# Patient Record
Sex: Female | Born: 1965 | Race: White | Hispanic: No | Marital: Single | Smoking: Never smoker
Health system: Southern US, Community
[De-identification: ages and names within clinical notes are randomized; demographics above are authoritative.]

## PROBLEM LIST (undated history)

## (undated) DIAGNOSIS — J309 Allergic rhinitis, unspecified: Secondary | ICD-10-CM

## (undated) DIAGNOSIS — M1990 Unspecified osteoarthritis, unspecified site: Secondary | ICD-10-CM

## (undated) DIAGNOSIS — R232 Flushing: Secondary | ICD-10-CM

## (undated) DIAGNOSIS — T7840XA Allergy, unspecified, initial encounter: Secondary | ICD-10-CM

## (undated) DIAGNOSIS — R7303 Prediabetes: Secondary | ICD-10-CM

## (undated) DIAGNOSIS — E0590 Thyrotoxicosis, unspecified without thyrotoxic crisis or storm: Secondary | ICD-10-CM

## (undated) DIAGNOSIS — E890 Postprocedural hypothyroidism: Secondary | ICD-10-CM

## (undated) DIAGNOSIS — I10 Essential (primary) hypertension: Secondary | ICD-10-CM

## (undated) HISTORY — DX: Allergic rhinitis, unspecified: J30.9

## (undated) HISTORY — DX: Postprocedural hypothyroidism: E89.0

## (undated) HISTORY — DX: Allergy, unspecified, initial encounter: T78.40XA

## (undated) HISTORY — DX: Flushing: R23.2

## (undated) HISTORY — DX: Unspecified osteoarthritis, unspecified site: M19.90

## (undated) HISTORY — DX: Prediabetes: R73.03

## (undated) HISTORY — DX: Thyrotoxicosis, unspecified without thyrotoxic crisis or storm: E05.90

## (undated) HISTORY — PX: THYROIDECTOMY, PARTIAL: SHX18

## (undated) HISTORY — PX: REPLACEMENT TOTAL KNEE: SUR1224

---

## 1993-10-30 HISTORY — PX: TUBAL LIGATION: SHX77

## 2012-05-28 ENCOUNTER — Ambulatory Visit: Payer: Self-pay | Admitting: Internal Medicine

## 2016-09-18 ENCOUNTER — Ambulatory Visit
Admission: RE | Admit: 2016-09-18 | Discharge: 2016-09-18 | Disposition: A | Discharge status: Home / Self Care | Payer: BLUE CROSS/BLUE SHIELD | Source: Ambulatory Visit | Attending: Internal Medicine | Admitting: Internal Medicine

## 2016-09-18 ENCOUNTER — Other Ambulatory Visit: Payer: Self-pay | Admitting: Internal Medicine

## 2016-09-18 ENCOUNTER — Other Ambulatory Visit
Admission: RE | Admit: 2016-09-18 | Discharge: 2016-09-18 | Disposition: A | Discharge status: Home / Self Care | Payer: BLUE CROSS/BLUE SHIELD | Source: Ambulatory Visit | Attending: Internal Medicine | Admitting: Internal Medicine

## 2016-09-18 DIAGNOSIS — R079 Chest pain, unspecified: Secondary | ICD-10-CM | POA: Diagnosis not present

## 2016-09-18 DIAGNOSIS — J90 Pleural effusion, not elsewhere classified: Secondary | ICD-10-CM | POA: Insufficient documentation

## 2016-09-18 DIAGNOSIS — O1490 Unspecified pre-eclampsia, unspecified trimester: Secondary | ICD-10-CM

## 2016-09-18 DIAGNOSIS — I2510 Atherosclerotic heart disease of native coronary artery without angina pectoris: Secondary | ICD-10-CM | POA: Diagnosis not present

## 2016-09-18 DIAGNOSIS — E048 Other specified nontoxic goiter: Secondary | ICD-10-CM | POA: Diagnosis not present

## 2016-09-18 DIAGNOSIS — R0602 Shortness of breath: Secondary | ICD-10-CM | POA: Insufficient documentation

## 2016-09-18 DIAGNOSIS — E669 Obesity, unspecified: Secondary | ICD-10-CM | POA: Diagnosis not present

## 2016-09-18 DIAGNOSIS — I517 Cardiomegaly: Secondary | ICD-10-CM | POA: Diagnosis not present

## 2016-09-18 DIAGNOSIS — I2699 Other pulmonary embolism without acute cor pulmonale: Secondary | ICD-10-CM | POA: Diagnosis not present

## 2016-09-18 HISTORY — DX: Essential (primary) hypertension: I10

## 2016-09-18 LAB — COMPREHENSIVE METABOLIC PANEL
ALBUMIN: 3.8 g/dL (ref 3.5–5.0)
ALT: 14 U/L (ref 14–54)
AST: 29 U/L (ref 15–41)
Alkaline Phosphatase: 77 U/L (ref 38–126)
Anion gap: 8 (ref 5–15)
BUN: 9 mg/dL (ref 6–20)
CHLORIDE: 108 mmol/L (ref 101–111)
CO2: 22 mmol/L (ref 22–32)
CREATININE: 0.46 mg/dL (ref 0.44–1.00)
Calcium: 9.4 mg/dL (ref 8.9–10.3)
GFR calc Af Amer: >60 mL/min (ref >60)
GLUCOSE: 107 mg/dL — AB (ref 65–99)
Potassium: 3.9 mmol/L (ref 3.5–5.1)
SODIUM: 138 mmol/L (ref 135–145)
Total Bilirubin: 0.6 mg/dL (ref 0.3–1.2)
Total Protein: 7.2 g/dL (ref 6.5–8.1)

## 2016-09-18 LAB — CBC WITH DIFFERENTIAL/PLATELET
BASOS ABS: 0 10*3/uL (ref 0–0.1)
BASOS PCT: 0 %
EOS PCT: 2 %
Eosinophils Absolute: 0.1 10*3/uL (ref 0–0.7)
HCT: 38.6 % (ref 35.0–47.0)
Hemoglobin: 13.4 g/dL (ref 12.0–16.0)
LYMPHS PCT: 42 %
Lymphs Abs: 3 10*3/uL (ref 1.0–3.6)
MCH: 30 pg (ref 26.0–34.0)
MCHC: 34.7 g/dL (ref 32.0–36.0)
MCV: 86.6 fL (ref 80.0–100.0)
Monocytes Absolute: 0.5 10*3/uL (ref 0.2–0.9)
Monocytes Relative: 7 %
Neutro Abs: 3.5 10*3/uL (ref 1.4–6.5)
Neutrophils Relative %: 49 %
PLATELETS: 235 10*3/uL (ref 150–440)
RBC: 4.46 MIL/uL (ref 3.80–5.20)
RDW: 13.1 % (ref 11.5–14.5)
WBC: 7.1 10*3/uL (ref 3.6–11.0)

## 2016-09-18 LAB — TROPONIN I

## 2016-09-18 LAB — BRAIN NATRIURETIC PEPTIDE: B NATRIURETIC PEPTIDE 5: 169 pg/mL — AB (ref 0.0–100.0)

## 2016-09-18 LAB — FIBRIN DERIVATIVES D-DIMER (ARMC ONLY): Fibrin derivatives D-dimer (ARMC): 833 — ABNORMAL HIGH (ref 0–499)

## 2016-09-18 LAB — CK TOTAL AND CKMB (NOT AT ARMC)
CK, MB: 1.6 ng/mL (ref 0.5–5.0)
RELATIVE INDEX: INVALID (ref 0.0–2.5)
Total CK: 36 U/L — ABNORMAL LOW (ref 38–234)

## 2016-09-18 LAB — TSH

## 2016-09-18 MED ORDER — IOPAMIDOL (ISOVUE-370) INJECTION 76%
75.0000 mL | Freq: Once | INTRAVENOUS | Status: AC | PRN
  Administered 2016-09-18: 75 mL via INTRAVENOUS

## 2016-10-06 DIAGNOSIS — E041 Nontoxic single thyroid nodule: Secondary | ICD-10-CM | POA: Diagnosis not present

## 2016-10-18 ENCOUNTER — Encounter: Payer: Self-pay | Admitting: Primary Care

## 2016-10-18 ENCOUNTER — Ambulatory Visit (INDEPENDENT_AMBULATORY_CARE_PROVIDER_SITE_OTHER): Payer: BLUE CROSS/BLUE SHIELD | Admitting: Primary Care

## 2016-10-18 VITALS — BP 122/68 | HR 78 | Temp 98.0°F | Ht 61.75 in | Wt 226.1 lb

## 2016-10-18 DIAGNOSIS — R7989 Other specified abnormal findings of blood chemistry: Secondary | ICD-10-CM

## 2016-10-18 DIAGNOSIS — Z1231 Encounter for screening mammogram for malignant neoplasm of breast: Secondary | ICD-10-CM

## 2016-10-18 DIAGNOSIS — Z1239 Encounter for other screening for malignant neoplasm of breast: Secondary | ICD-10-CM

## 2016-10-18 DIAGNOSIS — I10 Essential (primary) hypertension: Secondary | ICD-10-CM

## 2016-10-18 DIAGNOSIS — R739 Hyperglycemia, unspecified: Secondary | ICD-10-CM | POA: Diagnosis not present

## 2016-10-18 DIAGNOSIS — E039 Hypothyroidism, unspecified: Secondary | ICD-10-CM | POA: Insufficient documentation

## 2016-10-18 DIAGNOSIS — E0590 Thyrotoxicosis, unspecified without thyrotoxic crisis or storm: Secondary | ICD-10-CM | POA: Diagnosis not present

## 2016-10-18 LAB — HEMOGLOBIN A1C: HEMOGLOBIN A1C: 5.4 % (ref 4.6–6.5)

## 2016-10-18 LAB — BRAIN NATRIURETIC PEPTIDE: PRO B NATRI PEPTIDE: 41 pg/mL (ref 0.0–100.0)

## 2016-10-18 NOTE — Progress Notes (Signed)
Subjective:    Patient ID: Hannah Fritz, female    DOB: May 24, 1966, 50 y.o.   MRN: [id number]  HPI  [name] is a 50 year old female who presents today to establish care and discuss the problems mentioned below. Will obtain old records. Her last physical was years ago.   1) Essential Hypertension: Currently managed on lisinopril 5 mg and metoprolol tartrate 25 mg BID. She was also placed on furosemide 20 mg per cardiology in November due to complaints of shortness of breath and a BNP of 169. She was origionally thought to have a PE, but this was ruled out by CT. She was then found to have thyroid mass which was later diagnosed as hypothyroidism. She denies chest pain, shortness of breath, lower extremity edema. She's unsure why she's taking lasix.  2) Hyperthyroidism: Currently managed on methimazole 10 mg. She is currently following with Dr. Gabriel Carina through [facility]. She is due for re-evaluation Friday this week. She will be going for a thyroid ultrasound and biopsy.  3) Knee Pain: Located to the left knee that has been present for the past 1 week. She's also noticed swelling. She has a family history of osteoarthritis to the knees. Over the past several days her pain has improvement. She denies recent injury/trauma, weakness. She is feeling much better.  Review of Systems  Constitutional: Negative for fatigue.  Eyes: Negative for visual disturbance.  Respiratory: Negative for shortness of breath.   Cardiovascular: Negative for chest pain and palpitations.  Endocrine: Negative for heat intolerance.  Musculoskeletal: Positive for arthralgias.  Neurological: Negative for dizziness.       Past Medical History:  Diagnosis Date  . Hypertension   . Hyperthyroidism      Social History   Social History  . Marital status: Single    Spouse name: N/A  . Number of children: N/A  . Years of education: N/A   Occupational History  . Not on file.   Social History Main Topics  .  Smoking status: Never Smoker  . Smokeless tobacco: Not on file  . Alcohol use Yes  . Drug use: Unknown  . Sexual activity: Not on file   Other Topics Concern  . Not on file   Social History Narrative   Married.   3 children. 5 grandchildren.   Works as a Agricultural engineer.   Enjoys shopping, reading, spending time with family.    Past Surgical History:  Procedure Laterality Date  . TUBAL LIGATION  1995    Family History  Problem Relation Age of Onset  . Arthritis Father   . Hypertension Paternal Grandmother   . Hypertension Paternal Grandfather     Allergies  Allergen Reactions  . Sulfa Antibiotics Hives    No current outpatient prescriptions on file prior to visit.   No current facility-administered medications on file prior to visit.     BP 122/68   Pulse 78   Temp 98 F (36.7 C) (Oral)   Ht 5' 1.75" (1.568 m)   Wt 226 lb 1.9 oz (102.6 kg)   LMP 09/09/2016   SpO2 97%   BMI 41.69 kg/m    Objective:   Physical Exam  Constitutional: She appears well-nourished.  Neck: Neck supple.  Mass noted to right anterior neck above thyroid. Questionable nodule. Will have biopsy and [place] Friday.  Cardiovascular: Normal rate and regular rhythm.   Pulmonary/Chest: Effort normal and breath sounds normal.  Musculoskeletal: Normal range of motion.  Left knee: She exhibits normal range of motion, no swelling, no deformity and normal alignment. No tenderness found.  Skin: Skin is warm and dry.  Psychiatric: She has a normal mood and affect.          Assessment & Plan:  Hyperglycemia:  Noted on labs from hospital visit in November. Given obesity and HTN will check A1C to ensure no prediabetes. Discussed the importance of a healthy diet and regular exercise in order for weight loss, and to reduce the risk of other medical diseases.  Knee Pain:  Located to left knee x 1 week with swelling. Overall much improved with ibuprofen. Suspect osteoarthritic flare.  Discussed  weight loss, NSAIDS, rest, elevation, ice, knee brace.   Sheral Flow, NP

## 2016-10-18 NOTE — Assessment & Plan Note (Signed)
Following with endocrinology. Due for [place] and biopsy this week. Continue methimazole.

## 2016-10-18 NOTE — Assessment & Plan Note (Signed)
BP stable in the clinic today. Continue lisinopril and metoprolol. Will check BNP today, if normal will consider discontinuing lasix.

## 2016-10-18 NOTE — Patient Instructions (Signed)
Complete lab work prior to leaving today. I will notify you of your results once received.   Call the [facility] and schedule your mammogram.  Please schedule a physical with me within the next 3 months. You may also schedule a lab only appointment 3-4 days prior. We will discuss your lab results in detail during your physical.  Try Tylenol Arthritis or Aleve for knee pain and inflammation if it returns.  It was a pleasure to meet you today! Please don't hesitate to call me with any questions. Welcome to [facility]!

## 2016-10-18 NOTE — Progress Notes (Signed)
Pre visit review using our clinic review tool, if applicable. No additional management support is needed unless otherwise documented below in the visit note. 

## 2016-10-20 DIAGNOSIS — E041 Nontoxic single thyroid nodule: Secondary | ICD-10-CM | POA: Diagnosis not present

## 2016-10-21 DIAGNOSIS — E041 Nontoxic single thyroid nodule: Secondary | ICD-10-CM | POA: Diagnosis not present

## 2016-11-29 DIAGNOSIS — Z79899 Other long term (current) drug therapy: Secondary | ICD-10-CM | POA: Diagnosis not present

## 2016-11-29 DIAGNOSIS — I10 Essential (primary) hypertension: Secondary | ICD-10-CM | POA: Diagnosis not present

## 2016-11-29 DIAGNOSIS — E041 Nontoxic single thyroid nodule: Secondary | ICD-10-CM | POA: Diagnosis not present

## 2016-11-29 DIAGNOSIS — Z791 Long term (current) use of non-steroidal anti-inflammatories (NSAID): Secondary | ICD-10-CM | POA: Diagnosis not present

## 2016-11-29 DIAGNOSIS — E0510 Thyrotoxicosis with toxic single thyroid nodule without thyrotoxic crisis or storm: Secondary | ICD-10-CM | POA: Diagnosis not present

## 2016-11-29 DIAGNOSIS — E0590 Thyrotoxicosis, unspecified without thyrotoxic crisis or storm: Secondary | ICD-10-CM | POA: Diagnosis not present

## 2016-12-14 DIAGNOSIS — E041 Nontoxic single thyroid nodule: Secondary | ICD-10-CM | POA: Diagnosis not present

## 2016-12-15 DIAGNOSIS — E041 Nontoxic single thyroid nodule: Secondary | ICD-10-CM | POA: Diagnosis not present

## 2016-12-15 DIAGNOSIS — E0590 Thyrotoxicosis, unspecified without thyrotoxic crisis or storm: Secondary | ICD-10-CM | POA: Diagnosis not present

## 2017-01-10 ENCOUNTER — Other Ambulatory Visit: Payer: Self-pay | Admitting: Primary Care

## 2017-01-10 DIAGNOSIS — Z0000 Encounter for general adult medical examination without abnormal findings: Secondary | ICD-10-CM

## 2017-01-15 ENCOUNTER — Other Ambulatory Visit (INDEPENDENT_AMBULATORY_CARE_PROVIDER_SITE_OTHER): Payer: BLUE CROSS/BLUE SHIELD

## 2017-01-15 DIAGNOSIS — Z0000 Encounter for general adult medical examination without abnormal findings: Secondary | ICD-10-CM

## 2017-01-15 LAB — LIPID PANEL
CHOLESTEROL: 171 mg/dL (ref 0–200)
HDL: 53.4 mg/dL (ref >39.00)
LDL Cholesterol: 99 mg/dL (ref 0–99)
NonHDL: 117.66
TRIGLYCERIDES: 91 mg/dL (ref 0.0–149.0)
Total CHOL/HDL Ratio: 3
VLDL: 18.2 mg/dL (ref 0.0–40.0)

## 2017-01-15 LAB — COMPREHENSIVE METABOLIC PANEL
ALBUMIN: 3.8 g/dL (ref 3.5–5.2)
ALK PHOS: 108 U/L (ref 39–117)
ALT: 11 U/L (ref 0–35)
AST: 15 U/L (ref 0–37)
BILIRUBIN TOTAL: 0.5 mg/dL (ref 0.2–1.2)
BUN: 12 mg/dL (ref 6–23)
CALCIUM: 9.7 mg/dL (ref 8.4–10.5)
CHLORIDE: 101 meq/L (ref 96–112)
CO2: 29 mEq/L (ref 19–32)
CREATININE: 0.72 mg/dL (ref 0.40–1.20)
GFR: 90.77 mL/min (ref >60.00)
Glucose, Bld: 123 mg/dL — ABNORMAL HIGH (ref 70–99)
Potassium: 4.5 mEq/L (ref 3.5–5.1)
Sodium: 136 mEq/L (ref 135–145)
TOTAL PROTEIN: 7 g/dL (ref 6.0–8.3)

## 2017-01-18 ENCOUNTER — Ambulatory Visit (INDEPENDENT_AMBULATORY_CARE_PROVIDER_SITE_OTHER): Payer: BLUE CROSS/BLUE SHIELD | Admitting: Primary Care

## 2017-01-18 ENCOUNTER — Other Ambulatory Visit (HOSPITAL_COMMUNITY)
Admission: RE | Admit: 2017-01-18 | Discharge: 2017-01-18 | Disposition: A | Discharge status: Home / Self Care | Payer: BLUE CROSS/BLUE SHIELD | Source: Ambulatory Visit | Attending: Primary Care | Admitting: Primary Care

## 2017-01-18 ENCOUNTER — Encounter: Payer: Self-pay | Admitting: Primary Care

## 2017-01-18 VITALS — BP 120/78 | HR 72 | Temp 97.9°F | Ht 61.75 in | Wt 232.5 lb

## 2017-01-18 DIAGNOSIS — I10 Essential (primary) hypertension: Secondary | ICD-10-CM

## 2017-01-18 DIAGNOSIS — Z1211 Encounter for screening for malignant neoplasm of colon: Secondary | ICD-10-CM | POA: Diagnosis not present

## 2017-01-18 DIAGNOSIS — Z0000 Encounter for general adult medical examination without abnormal findings: Secondary | ICD-10-CM

## 2017-01-18 DIAGNOSIS — E0590 Thyrotoxicosis, unspecified without thyrotoxic crisis or storm: Secondary | ICD-10-CM

## 2017-01-18 DIAGNOSIS — R739 Hyperglycemia, unspecified: Secondary | ICD-10-CM

## 2017-01-18 DIAGNOSIS — Z23 Encounter for immunization: Secondary | ICD-10-CM

## 2017-01-18 DIAGNOSIS — R7303 Prediabetes: Secondary | ICD-10-CM | POA: Insufficient documentation

## 2017-01-18 LAB — HEMOGLOBIN A1C: Hgb A1c MFr Bld: 5.7 % (ref 4.6–6.5)

## 2017-01-18 NOTE — Addendum Note (Signed)
Addended by: Modena Nunnery on: 01/18/2017 11:23 AM   Modules accepted: Orders

## 2017-01-18 NOTE — Patient Instructions (Signed)
Complete lab work prior to leaving today. I will notify you of your results once received.   You will be contacted regarding your referral to GI for the colonoscopy.  Please let us know if you have not heard back within one week.   Schedule your mammogram as discussed.  Start exercising. You should be getting 150 minutes of moderate intensity exercise weekly.  It's important to improve your diet by increasing consumption of fresh vegetables and fruits, whole grains, water.  Ensure you are drinking 64 ounces of water daily.  Follow up in 1 year for your annual exam or sooner if needed.  It was a pleasure to see you today!

## 2017-01-18 NOTE — Assessment & Plan Note (Signed)
Td due, administered today. Pap due, completed today. Mammogram due, pending. Colonoscopy due, ordered. Discussed the importance of a healthy diet and regular exercise in order for weight loss, and to reduce the risk of other medical diseases. Exam unremarkable. Labs with hyperglycemia, check A1C. Follow up in 1 year for annual exam.

## 2017-01-18 NOTE — Assessment & Plan Note (Signed)
Due for surgery soon.

## 2017-01-18 NOTE — Assessment & Plan Note (Signed)
Noted on recent labs. A1C in December normal. Recheck A1C today.

## 2017-01-18 NOTE — Assessment & Plan Note (Signed)
Stable today, continue lisinopril and Lopressor. BMP unremarkable.

## 2017-01-18 NOTE — Progress Notes (Signed)
Pre visit review using our clinic review tool, if applicable. No additional management support is needed unless otherwise documented below in the visit note. 

## 2017-01-18 NOTE — Progress Notes (Signed)
Subjective:    Patient ID: Hannah Fritz, female    DOB: [birth year], 51 y.o.   MRN: [id number]  HPI  Hannah Fritz is a 51 year old female who presents today for complete physical.  Immunizations: -Tetanus: Completed over 10 years ago. -Influenza: Did not complete last season.   Diet: She endorses a fair diet. Breakfast: Fast food, crackers, toast Lunch: Left overs Dinner: Restaurants, meat, vegetables, starch, pizza Snacks: Crackers, chips Desserts: Occasionally  Beverages: Water  Exercise: She does not currently exercise. Eye exam: Completed several years ago. Due. Dental exam: Completes annually Colonoscopy: Due. Pap Smear: Due. Pending today. Mammogram: Ordered. Due.   Review of Systems  Constitutional: Negative for unexpected weight change.  HENT: Negative for rhinorrhea.   Respiratory: Negative for cough and shortness of breath.   Cardiovascular: Negative for chest pain.  Gastrointestinal: Negative for constipation and diarrhea.  Genitourinary: Negative for difficulty urinating and menstrual problem.  Musculoskeletal: Negative for arthralgias and myalgias.  Skin: Negative for rash.       Mass to left forearm.  Allergic/Immunologic: Negative for environmental allergies.  Neurological: Negative for dizziness, numbness and headaches.       Past Medical History:  Diagnosis Date  . Hypertension   . Hyperthyroidism      Social History   Social History  . Marital status: Single    Spouse name: N/A  . Number of children: N/A  . Years of education: N/A   Occupational History  . Not on file.   Social History Main Topics  . Smoking status: Never Smoker  . Smokeless tobacco: Never Used  . Alcohol use Yes  . Drug use: Unknown  . Sexual activity: Not on file   Other Topics Concern  . Not on file   Social History Narrative   Married.   3 children. 5 grandchildren.   Works as a Agricultural engineer.   Enjoys shopping, reading, spending time with family.     Past Surgical History:  Procedure Laterality Date  . TUBAL LIGATION  1995    Family History  Problem Relation Age of Onset  . Arthritis Father   . Hypertension Paternal Grandmother   . Hypertension Paternal Grandfather     Allergies  Allergen Reactions  . Sulfa Antibiotics Hives    Current Outpatient Prescriptions on File Prior to Visit  Medication Sig Dispense Refill  . furosemide (LASIX) 20 MG tablet Take 20 mg by mouth daily.     Marland Kitchen lisinopril (PRINIVIL,ZESTRIL) 5 MG tablet Take 5 mg by mouth daily.     . methimazole (TAPAZOLE) 10 MG tablet Take 10 mg by mouth daily.     . metoprolol tartrate (LOPRESSOR) 25 MG tablet Take 25 mg by mouth 2 (two) times daily.      No current facility-administered medications on file prior to visit.     BP 120/78   Pulse 72   Temp 97.9 F (36.6 C) (Oral)   Ht 5' 1.75" (1.568 m)   Wt 232 lb 8 oz (105.5 kg)   SpO2 99%   BMI 42.87 kg/m    Objective:   Physical Exam  Constitutional: She is oriented to person, place, and time. She appears well-nourished.  HENT:  Right Ear: Tympanic membrane and ear canal normal.  Left Ear: Tympanic membrane and ear canal normal.  Nose: Nose normal.  Mouth/Throat: Oropharynx is clear and moist.  Eyes: Conjunctivae and EOM are normal. Pupils are equal, round, and reactive to light.  Neck: Neck supple. No  thyromegaly present.  Cardiovascular: Normal rate and regular rhythm.   No murmur heard. Pulmonary/Chest: Effort normal and breath sounds normal. She has no rales.  Abdominal: Soft. Bowel sounds are normal. There is no tenderness.  Genitourinary: There is no tenderness or lesion on the right labia. There is no tenderness or lesion on the left labia. Cervix exhibits no motion tenderness and no discharge. Right adnexum displays no tenderness. Left adnexum displays no tenderness. No erythema in the vagina. No vaginal discharge found.  Musculoskeletal: Normal range of motion.  Lymphadenopathy:    She  has no cervical adenopathy.  Neurological: She is alert and oriented to person, place, and time. She has normal reflexes. No cranial nerve deficit.  Skin: Skin is warm and dry. No rash noted.  Small 1 cm circular superficial mass to left forearm. Non tender.  Psychiatric: She has a normal mood and affect.          Assessment & Plan:

## 2017-01-19 ENCOUNTER — Encounter: Payer: Self-pay | Admitting: *Deleted

## 2017-01-19 LAB — CYTOLOGY - PAP
Diagnosis: NEGATIVE
HPV: NOT DETECTED

## 2017-01-22 ENCOUNTER — Encounter: Payer: Self-pay | Admitting: *Deleted

## 2017-01-26 DIAGNOSIS — Z882 Allergy status to sulfonamides status: Secondary | ICD-10-CM | POA: Diagnosis not present

## 2017-01-26 DIAGNOSIS — Z6841 Body Mass Index (BMI) 40.0 and over, adult: Secondary | ICD-10-CM | POA: Diagnosis not present

## 2017-01-26 DIAGNOSIS — Z79899 Other long term (current) drug therapy: Secondary | ICD-10-CM | POA: Diagnosis not present

## 2017-01-26 DIAGNOSIS — I471 Supraventricular tachycardia: Secondary | ICD-10-CM | POA: Diagnosis not present

## 2017-01-26 DIAGNOSIS — I10 Essential (primary) hypertension: Secondary | ICD-10-CM | POA: Diagnosis not present

## 2017-01-26 DIAGNOSIS — E0520 Thyrotoxicosis with toxic multinodular goiter without thyrotoxic crisis or storm: Secondary | ICD-10-CM | POA: Diagnosis not present

## 2017-01-26 DIAGNOSIS — E041 Nontoxic single thyroid nodule: Secondary | ICD-10-CM | POA: Diagnosis not present

## 2017-01-26 DIAGNOSIS — J811 Chronic pulmonary edema: Secondary | ICD-10-CM | POA: Diagnosis not present

## 2017-01-26 DIAGNOSIS — E0500 Thyrotoxicosis with diffuse goiter without thyrotoxic crisis or storm: Secondary | ICD-10-CM | POA: Diagnosis not present

## 2017-01-26 DIAGNOSIS — Z888 Allergy status to other drugs, medicaments and biological substances status: Secondary | ICD-10-CM | POA: Diagnosis not present

## 2017-01-26 DIAGNOSIS — E669 Obesity, unspecified: Secondary | ICD-10-CM | POA: Diagnosis not present

## 2017-01-27 DIAGNOSIS — Z79899 Other long term (current) drug therapy: Secondary | ICD-10-CM | POA: Diagnosis not present

## 2017-01-27 DIAGNOSIS — I10 Essential (primary) hypertension: Secondary | ICD-10-CM | POA: Diagnosis not present

## 2017-01-27 DIAGNOSIS — E669 Obesity, unspecified: Secondary | ICD-10-CM | POA: Diagnosis not present

## 2017-01-27 DIAGNOSIS — Z882 Allergy status to sulfonamides status: Secondary | ICD-10-CM | POA: Diagnosis not present

## 2017-01-27 DIAGNOSIS — I471 Supraventricular tachycardia: Secondary | ICD-10-CM | POA: Diagnosis not present

## 2017-01-27 DIAGNOSIS — Z6841 Body Mass Index (BMI) 40.0 and over, adult: Secondary | ICD-10-CM | POA: Diagnosis not present

## 2017-01-27 DIAGNOSIS — E0520 Thyrotoxicosis with toxic multinodular goiter without thyrotoxic crisis or storm: Secondary | ICD-10-CM | POA: Diagnosis not present

## 2017-01-27 DIAGNOSIS — Z888 Allergy status to other drugs, medicaments and biological substances status: Secondary | ICD-10-CM | POA: Diagnosis not present

## 2017-01-31 DIAGNOSIS — E0590 Thyrotoxicosis, unspecified without thyrotoxic crisis or storm: Secondary | ICD-10-CM | POA: Diagnosis not present

## 2017-02-19 DIAGNOSIS — Z9889 Other specified postprocedural states: Secondary | ICD-10-CM | POA: Diagnosis not present

## 2017-02-19 DIAGNOSIS — E0590 Thyrotoxicosis, unspecified without thyrotoxic crisis or storm: Secondary | ICD-10-CM | POA: Diagnosis not present

## 2017-03-20 DIAGNOSIS — E890 Postprocedural hypothyroidism: Secondary | ICD-10-CM | POA: Diagnosis not present

## 2017-03-20 DIAGNOSIS — E042 Nontoxic multinodular goiter: Secondary | ICD-10-CM | POA: Diagnosis not present

## 2017-03-20 DIAGNOSIS — R490 Dysphonia: Secondary | ICD-10-CM | POA: Diagnosis not present

## 2017-03-20 DIAGNOSIS — K5900 Constipation, unspecified: Secondary | ICD-10-CM | POA: Diagnosis not present

## 2017-04-03 DIAGNOSIS — E0590 Thyrotoxicosis, unspecified without thyrotoxic crisis or storm: Secondary | ICD-10-CM | POA: Diagnosis not present

## 2017-04-10 DIAGNOSIS — E890 Postprocedural hypothyroidism: Secondary | ICD-10-CM | POA: Diagnosis not present

## 2017-04-10 DIAGNOSIS — Z9889 Other specified postprocedural states: Secondary | ICD-10-CM | POA: Diagnosis not present

## 2017-04-16 ENCOUNTER — Other Ambulatory Visit: Payer: Self-pay

## 2017-04-16 MED ORDER — LISINOPRIL 5 MG PO TABS: 5.0000 mg | ORAL_TABLET | Freq: Every day | ORAL | 5 refills | Status: DC

## 2017-04-16 MED ORDER — METOPROLOL TARTRATE 25 MG PO TABS: 25.0000 mg | ORAL_TABLET | Freq: Two times a day (BID) | ORAL | 5 refills | Status: DC

## 2017-04-16 NOTE — Telephone Encounter (Signed)
Patient is calling to request refills on her Lisinopril 5mg  and her Metoprolol 25mg .  Last OV: 01/18/17, bp stable and to continue taking lisinopril and metoprolol.  Recheck in 1 year. Last Refill:  Do not see any refills on file for our office.  Lisinopril 5mg , #30, 5 refills sent in today. Metoprolol 25mg  bid #30, 5 refills sent in today.

## 2017-05-18 DIAGNOSIS — M25562 Pain in left knee: Secondary | ICD-10-CM | POA: Diagnosis not present

## 2017-05-18 DIAGNOSIS — M25571 Pain in right ankle and joints of right foot: Secondary | ICD-10-CM | POA: Diagnosis not present

## 2017-09-18 DIAGNOSIS — M25561 Pain in right knee: Secondary | ICD-10-CM | POA: Diagnosis not present

## 2017-09-18 DIAGNOSIS — M25562 Pain in left knee: Secondary | ICD-10-CM | POA: Diagnosis not present

## 2017-10-16 ENCOUNTER — Other Ambulatory Visit: Payer: Self-pay | Admitting: Primary Care

## 2017-10-31 DIAGNOSIS — E890 Postprocedural hypothyroidism: Secondary | ICD-10-CM | POA: Diagnosis not present

## 2017-11-07 DIAGNOSIS — E890 Postprocedural hypothyroidism: Secondary | ICD-10-CM | POA: Diagnosis not present

## 2017-12-19 DIAGNOSIS — E890 Postprocedural hypothyroidism: Secondary | ICD-10-CM | POA: Diagnosis not present

## 2018-01-09 DIAGNOSIS — M25561 Pain in right knee: Secondary | ICD-10-CM | POA: Diagnosis not present

## 2018-01-09 DIAGNOSIS — M25562 Pain in left knee: Secondary | ICD-10-CM | POA: Diagnosis not present

## 2018-01-22 ENCOUNTER — Other Ambulatory Visit: Payer: Self-pay | Admitting: Primary Care

## 2018-01-28 DIAGNOSIS — R948 Abnormal results of function studies of other organs and systems: Secondary | ICD-10-CM | POA: Diagnosis not present

## 2018-01-28 DIAGNOSIS — Z6841 Body Mass Index (BMI) 40.0 and over, adult: Secondary | ICD-10-CM | POA: Diagnosis not present

## 2018-01-29 DIAGNOSIS — R635 Abnormal weight gain: Secondary | ICD-10-CM | POA: Diagnosis not present

## 2018-01-29 DIAGNOSIS — I10 Essential (primary) hypertension: Secondary | ICD-10-CM | POA: Diagnosis not present

## 2018-01-29 DIAGNOSIS — I471 Supraventricular tachycardia: Secondary | ICD-10-CM | POA: Diagnosis not present

## 2018-01-29 DIAGNOSIS — Z6841 Body Mass Index (BMI) 40.0 and over, adult: Secondary | ICD-10-CM | POA: Diagnosis not present

## 2018-02-01 DIAGNOSIS — M545 Low back pain: Secondary | ICD-10-CM | POA: Diagnosis not present

## 2018-02-07 DIAGNOSIS — Z713 Dietary counseling and surveillance: Secondary | ICD-10-CM | POA: Diagnosis not present

## 2018-02-07 DIAGNOSIS — Z6841 Body Mass Index (BMI) 40.0 and over, adult: Secondary | ICD-10-CM | POA: Diagnosis not present

## 2018-02-18 ENCOUNTER — Other Ambulatory Visit: Payer: Self-pay | Admitting: Orthopedic Surgery

## 2018-02-18 DIAGNOSIS — Z713 Dietary counseling and surveillance: Secondary | ICD-10-CM | POA: Diagnosis not present

## 2018-02-18 DIAGNOSIS — Z6841 Body Mass Index (BMI) 40.0 and over, adult: Secondary | ICD-10-CM | POA: Diagnosis not present

## 2018-02-18 DIAGNOSIS — M545 Low back pain: Secondary | ICD-10-CM | POA: Diagnosis not present

## 2018-02-18 DIAGNOSIS — M5416 Radiculopathy, lumbar region: Secondary | ICD-10-CM

## 2018-02-23 ENCOUNTER — Ambulatory Visit
Admission: RE | Admit: 2018-02-23 | Discharge: 2018-02-23 | Disposition: A | Discharge status: Home / Self Care | Payer: BLUE CROSS/BLUE SHIELD | Source: Ambulatory Visit | Attending: Orthopedic Surgery | Admitting: Orthopedic Surgery

## 2018-02-23 DIAGNOSIS — M545 Low back pain: Secondary | ICD-10-CM | POA: Diagnosis not present

## 2018-02-23 DIAGNOSIS — M5416 Radiculopathy, lumbar region: Secondary | ICD-10-CM

## 2018-03-04 DIAGNOSIS — Z713 Dietary counseling and surveillance: Secondary | ICD-10-CM | POA: Diagnosis not present

## 2018-03-04 DIAGNOSIS — Z6841 Body Mass Index (BMI) 40.0 and over, adult: Secondary | ICD-10-CM | POA: Diagnosis not present

## 2018-03-13 DIAGNOSIS — M545 Low back pain: Secondary | ICD-10-CM | POA: Diagnosis not present

## 2018-03-14 DIAGNOSIS — M5416 Radiculopathy, lumbar region: Secondary | ICD-10-CM | POA: Diagnosis not present

## 2018-03-14 DIAGNOSIS — M25561 Pain in right knee: Secondary | ICD-10-CM | POA: Diagnosis not present

## 2018-03-18 ENCOUNTER — Telehealth: Payer: Self-pay | Admitting: Primary Care

## 2018-03-20 ENCOUNTER — Ambulatory Visit: Payer: BLUE CROSS/BLUE SHIELD | Admitting: Primary Care

## 2018-03-20 ENCOUNTER — Encounter: Payer: Self-pay | Admitting: Primary Care

## 2018-03-20 VITALS — BP 140/84 | HR 65 | Temp 97.8°F | Ht 61.75 in | Wt 220.2 lb

## 2018-03-20 DIAGNOSIS — I10 Essential (primary) hypertension: Secondary | ICD-10-CM

## 2018-03-20 DIAGNOSIS — Z01818 Encounter for other preprocedural examination: Secondary | ICD-10-CM

## 2018-03-20 DIAGNOSIS — E0590 Thyrotoxicosis, unspecified without thyrotoxic crisis or storm: Secondary | ICD-10-CM | POA: Diagnosis not present

## 2018-03-20 DIAGNOSIS — R739 Hyperglycemia, unspecified: Secondary | ICD-10-CM

## 2018-03-20 NOTE — Assessment & Plan Note (Signed)
Slightly above goal today, is compliant to her medications. She does endorse a lower reading this morning during her last doctor's appointment.  Will have her monitor and report readings at or above 135/90.

## 2018-03-20 NOTE — Patient Instructions (Signed)
You are cleared for surgery.  Congratulations on your weight loss!  Best of luck during surgery and recovery!

## 2018-03-20 NOTE — Progress Notes (Signed)
Subjective:    Patient ID: Hannah Fritz, female    DOB: April 13, 1966, 52 y.o.   MRN: [id number]  HPI  Hannah Fritz is a 52 year old female who presents today for surgical clearance.  She will be undergoing right total knee replacement within the near future. She underwent lab work this morning through her weight management clinic which included Thyroid labs, CBC, CMP. She's also had lab work completed in April 2019 which included A1C and lipids. A1C of 5.4 and LDL of 122, HDL of 62, TC of 182.  She's compliant to her lisinopril 5 mg daily and metoprolol 25 mg BID. She's not checking her BP at home. Her BP this morning at the weight loss management center was 135/84. She has lost around 20 pounds since her last visit. She denies dizziness, chest pain, shortness of breath.   BP Readings from Last 3 Encounters:  03/20/18 140/84  01/18/17 120/78  10/18/16 122/68     Review of Systems  Constitutional: Negative for unexpected weight change.  HENT: Negative for rhinorrhea.   Respiratory: Negative for cough and shortness of breath.   Cardiovascular: Negative for chest pain.  Gastrointestinal: Negative for constipation and diarrhea.  Genitourinary: Negative for difficulty urinating and menstrual problem.  Musculoskeletal: Negative for arthralgias and myalgias.  Skin: Negative for rash.  Allergic/Immunologic: Negative for environmental allergies.  Neurological: Negative for dizziness, numbness and headaches.       Past Medical History:  Diagnosis Date  . Hypertension   . Hyperthyroidism      Social History   Socioeconomic History  . Marital status: Single    Spouse name: Not on file  . Number of children: Not on file  . Years of education: Not on file  . Highest education level: Not on file  Occupational History  . Not on file  Social Needs  . Financial resource strain: Not on file  . Food insecurity:    Worry: Not on file    Inability: Not on file  . Transportation needs:     Medical: Not on file    Non-medical: Not on file  Tobacco Use  . Smoking status: Never Smoker  . Smokeless tobacco: Never Used  Substance and Sexual Activity  . Alcohol use: Yes  . Drug use: Not on file  . Sexual activity: Not on file  Lifestyle  . Physical activity:    Days per week: Not on file    Minutes per session: Not on file  . Stress: Not on file  Relationships  . Social connections:    Talks on phone: Not on file    Gets together: Not on file    Attends religious service: Not on file    Active member of club or organization: Not on file    Attends meetings of clubs or organizations: Not on file    Relationship status: Not on file  . Intimate partner violence:    Fear of current or ex partner: Not on file    Emotionally abused: Not on file    Physically abused: Not on file    Forced sexual activity: Not on file  Other Topics Concern  . Not on file  Social History Narrative   Married.   3 children. 5 grandchildren.   Works as a Agricultural engineer.   Enjoys shopping, reading, spending time with family.     Family History  Problem Relation Age of Onset  . Arthritis Father   . Hypertension Paternal Grandmother   .  Hypertension Paternal Grandfather     Allergies  Allergen Reactions  . Sulfa Antibiotics Hives    Current Outpatient Medications on File Prior to Visit  Medication Sig Dispense Refill  . lisinopril (PRINIVIL,ZESTRIL) 5 MG tablet Take 1 tablet (5 mg total) by mouth daily. NEED APPOINTMENT FOR ANY MORE REFILLS 30 tablet 0  . metoprolol tartrate (LOPRESSOR) 25 MG tablet Take 1 tablet (25 mg total) by mouth 2 (two) times daily. NEED APPOINTMENT FOR ANY MORE REFILLS 60 tablet 0  . topiramate (TOPAMAX) 50 MG tablet Take 50 mg by mouth.      No current facility-administered medications on file prior to visit.     BP 140/84   Pulse 65   Temp 97.8 F (36.6 C) (Oral)   Ht 5' 1.75" (1.568 m)   Wt 220 lb 4 oz (99.9 kg)   SpO2 99%   BMI 40.61 kg/m     Objective:   Physical Exam  Constitutional: She is oriented to person, place, and time. She appears well-nourished.  HENT:  Right Ear: Tympanic membrane and ear canal normal.  Left Ear: Tympanic membrane and ear canal normal.  Nose: Nose normal.  Mouth/Throat: Oropharynx is clear and moist.  Eyes: Pupils are equal, round, and reactive to light. Conjunctivae and EOM are normal.  Neck: Neck supple. No thyromegaly present.  Cardiovascular: Normal rate and regular rhythm.  No murmur heard. Pulmonary/Chest: Effort normal and breath sounds normal. She has no rales.  Abdominal: Soft. Bowel sounds are normal. There is no tenderness.  Musculoskeletal: Normal range of motion.  Lymphadenopathy:    She has no cervical adenopathy.  Neurological: She is alert and oriented to person, place, and time. She has normal reflexes. No cranial nerve deficit.  Skin: Skin is warm and dry. No rash noted.  Psychiatric: She has a normal mood and affect.          Assessment & Plan:  Surgical Clearance:  Surgery pending for near future, right total knee replacement. Exam today unremarkable. All labs and ECG reviewed from [facility] and are stable. Cleared for surgery. Form signed and faxed.  Pleas Koch, NP

## 2018-03-20 NOTE — Assessment & Plan Note (Signed)
Underwent partial thyroidectomy. TSH from recent labs normal. Following with endocrinology.

## 2018-03-20 NOTE — Assessment & Plan Note (Signed)
Recent A1C of 5.4 which was noted through [place]. Commended her on weight loss. Will continue to monitor.

## 2018-03-26 ENCOUNTER — Other Ambulatory Visit: Payer: Self-pay | Admitting: Primary Care

## 2018-03-27 ENCOUNTER — Other Ambulatory Visit: Payer: Self-pay | Admitting: Primary Care

## 2018-03-27 NOTE — Telephone Encounter (Signed)
Last OV for surgery clearance on 03/20/18 Patient will be due for preventative exam; however, just underwent extensive eval and blood work for surg clearance.  Last Refill # 60 on 01/22/18.  Will refill X 3 months for now and may need preventative exam/follow up in the fall.

## 2018-03-27 NOTE — Telephone Encounter (Signed)
Last office visit for surgery clearance on: 03/20/18  Is past due for preventative health exam but did have extensive exam and blood work in recent months.    Last refill:  #30 on 01/22/18  Will refill X 3 months.

## 2018-04-04 DIAGNOSIS — K5909 Other constipation: Secondary | ICD-10-CM | POA: Diagnosis not present

## 2018-04-04 DIAGNOSIS — Z6841 Body Mass Index (BMI) 40.0 and over, adult: Secondary | ICD-10-CM | POA: Diagnosis not present

## 2018-04-04 DIAGNOSIS — M25569 Pain in unspecified knee: Secondary | ICD-10-CM | POA: Diagnosis not present

## 2018-05-15 DIAGNOSIS — M25569 Pain in unspecified knee: Secondary | ICD-10-CM | POA: Diagnosis not present

## 2018-05-15 DIAGNOSIS — I10 Essential (primary) hypertension: Secondary | ICD-10-CM | POA: Diagnosis not present

## 2018-05-15 DIAGNOSIS — Z6839 Body mass index (BMI) 39.0-39.9, adult: Secondary | ICD-10-CM | POA: Diagnosis not present

## 2018-05-17 DIAGNOSIS — M25561 Pain in right knee: Secondary | ICD-10-CM | POA: Diagnosis not present

## 2018-05-17 DIAGNOSIS — Z01812 Encounter for preprocedural laboratory examination: Secondary | ICD-10-CM | POA: Diagnosis not present

## 2018-05-20 DIAGNOSIS — E6609 Other obesity due to excess calories: Secondary | ICD-10-CM | POA: Diagnosis not present

## 2018-05-20 DIAGNOSIS — I10 Essential (primary) hypertension: Secondary | ICD-10-CM | POA: Diagnosis not present

## 2018-05-20 DIAGNOSIS — Z01818 Encounter for other preprocedural examination: Secondary | ICD-10-CM | POA: Diagnosis not present

## 2018-05-20 DIAGNOSIS — I471 Supraventricular tachycardia: Secondary | ICD-10-CM | POA: Diagnosis not present

## 2018-06-05 DIAGNOSIS — M25561 Pain in right knee: Secondary | ICD-10-CM | POA: Diagnosis not present

## 2018-06-13 DIAGNOSIS — M1711 Unilateral primary osteoarthritis, right knee: Secondary | ICD-10-CM | POA: Diagnosis not present

## 2018-06-18 ENCOUNTER — Other Ambulatory Visit: Payer: Self-pay | Admitting: Primary Care

## 2018-06-20 ENCOUNTER — Other Ambulatory Visit: Payer: Self-pay | Admitting: Primary Care

## 2018-07-22 DIAGNOSIS — Z6839 Body mass index (BMI) 39.0-39.9, adult: Secondary | ICD-10-CM | POA: Diagnosis not present

## 2018-07-22 DIAGNOSIS — Z713 Dietary counseling and surveillance: Secondary | ICD-10-CM | POA: Diagnosis not present

## 2018-07-22 DIAGNOSIS — K5900 Constipation, unspecified: Secondary | ICD-10-CM | POA: Diagnosis not present

## 2018-07-22 DIAGNOSIS — E669 Obesity, unspecified: Secondary | ICD-10-CM | POA: Diagnosis not present

## 2018-09-23 DIAGNOSIS — Z13228 Encounter for screening for other metabolic disorders: Secondary | ICD-10-CM | POA: Diagnosis not present

## 2018-09-23 DIAGNOSIS — I10 Essential (primary) hypertension: Secondary | ICD-10-CM | POA: Diagnosis not present

## 2018-09-23 DIAGNOSIS — Z6841 Body Mass Index (BMI) 40.0 and over, adult: Secondary | ICD-10-CM | POA: Diagnosis not present

## 2018-09-30 ENCOUNTER — Encounter: Payer: Self-pay | Admitting: *Deleted

## 2018-09-30 ENCOUNTER — Other Ambulatory Visit: Payer: Self-pay | Admitting: Primary Care

## 2018-09-30 DIAGNOSIS — I10 Essential (primary) hypertension: Secondary | ICD-10-CM

## 2018-09-30 NOTE — Telephone Encounter (Signed)
BMP and lipids from care everywhere dated 01/29/18 was reviewed during visit and also now. BMP and lipids stable. Refills sent to pharmacy.

## 2018-09-30 NOTE — Telephone Encounter (Signed)
Last office visit 03/20/2018 for Preoperative Clearance.  Last CPE 0322/2018.  No future appointments. Last refilled Lisinopril #30 with 2 refills on 06/18/2018 and Metoprolol 03/27/2018 for #60 with 2 refills.  Last labs done on 01/15/2017.  Refill?

## 2019-03-31 ENCOUNTER — Telehealth: Payer: Self-pay | Admitting: Primary Care

## 2019-03-31 DIAGNOSIS — I10 Essential (primary) hypertension: Secondary | ICD-10-CM

## 2019-04-01 NOTE — Telephone Encounter (Signed)
Please notify patient that she will need a CPE or follow up visit for further refills. We will provide her with a 30 day supply until she's seen. Thanks.

## 2019-04-01 NOTE — Telephone Encounter (Signed)
Please advise last OV 03/20/2018 for preop clearance, last CPE 01/18/2017

## 2019-04-02 ENCOUNTER — Other Ambulatory Visit: Payer: Self-pay | Admitting: Primary Care

## 2019-04-02 DIAGNOSIS — R7303 Prediabetes: Secondary | ICD-10-CM

## 2019-04-02 DIAGNOSIS — E0590 Thyrotoxicosis, unspecified without thyrotoxic crisis or storm: Secondary | ICD-10-CM

## 2019-04-02 DIAGNOSIS — I10 Essential (primary) hypertension: Secondary | ICD-10-CM

## 2019-04-11 ENCOUNTER — Other Ambulatory Visit: Payer: Self-pay

## 2019-04-11 ENCOUNTER — Other Ambulatory Visit (INDEPENDENT_AMBULATORY_CARE_PROVIDER_SITE_OTHER): Payer: BC Managed Care – PPO

## 2019-04-11 ENCOUNTER — Telehealth: Payer: Self-pay

## 2019-04-11 DIAGNOSIS — E0590 Thyrotoxicosis, unspecified without thyrotoxic crisis or storm: Secondary | ICD-10-CM

## 2019-04-11 DIAGNOSIS — I10 Essential (primary) hypertension: Secondary | ICD-10-CM

## 2019-04-11 DIAGNOSIS — R7303 Prediabetes: Secondary | ICD-10-CM | POA: Diagnosis not present

## 2019-04-11 LAB — LIPID PANEL
Cholesterol: 172 mg/dL (ref 0–200)
HDL: 53.4 mg/dL (ref >39.00)
LDL Cholesterol: 103 mg/dL — ABNORMAL HIGH (ref 0–99)
NonHDL: 118.75
Total CHOL/HDL Ratio: 3
Triglycerides: 81 mg/dL (ref 0.0–149.0)
VLDL: 16.2 mg/dL (ref 0.0–40.0)

## 2019-04-11 LAB — COMPREHENSIVE METABOLIC PANEL
ALT: 9 U/L (ref 0–35)
AST: 16 U/L (ref 0–37)
Albumin: 3.9 g/dL (ref 3.5–5.2)
Alkaline Phosphatase: 83 U/L (ref 39–117)
BUN: 15 mg/dL (ref 6–23)
CO2: 24 mEq/L (ref 19–32)
Calcium: 9.2 mg/dL (ref 8.4–10.5)
Chloride: 105 mEq/L (ref 96–112)
Creatinine, Ser: 0.73 mg/dL (ref 0.40–1.20)
GFR: 83.32 mL/min (ref >60.00)
Glucose, Bld: 101 mg/dL — ABNORMAL HIGH (ref 70–99)
Potassium: 4.1 mEq/L (ref 3.5–5.1)
Sodium: 138 mEq/L (ref 135–145)
Total Bilirubin: 0.4 mg/dL (ref 0.2–1.2)
Total Protein: 7 g/dL (ref 6.0–8.3)

## 2019-04-11 LAB — HEMOGLOBIN A1C: Hgb A1c MFr Bld: 5.7 % (ref 4.6–6.5)

## 2019-04-11 LAB — TSH: TSH: 7.12 u[IU]/mL — ABNORMAL HIGH (ref 0.35–4.50)

## 2019-04-11 NOTE — Telephone Encounter (Signed)
Terri in lab said pt has already had labs drawn,nothing further needed.

## 2019-04-11 NOTE — Telephone Encounter (Signed)
[place] [facility] - Client Nonclinical Telephone Record [facility] Client [place] [facility] - Client Client Site [place] Physician Alma Friendly - NP Contact Type Call Who Is Calling Patient / Member / Family / Caregiver Caller Name [place] Phone Number [phone] Patient Name Hannah Fritz Patient DOB November 16, 1965 Call Type Message Only Information Provided Reason for Call Request for General Office Information Initial Comment Caller states she is waiting for her appointment. Additional Comment Call Closed By: Izola Price Transaction Date/Time: 04/11/2019 7:33:50 AM (ET)

## 2019-04-16 ENCOUNTER — Ambulatory Visit (INDEPENDENT_AMBULATORY_CARE_PROVIDER_SITE_OTHER): Payer: BC Managed Care – PPO | Admitting: Primary Care

## 2019-04-16 ENCOUNTER — Encounter: Payer: Self-pay | Admitting: Primary Care

## 2019-04-16 ENCOUNTER — Other Ambulatory Visit: Payer: Self-pay

## 2019-04-16 VITALS — BP 134/80 | HR 69 | Temp 97.9°F | Ht 61.75 in | Wt 249.0 lb

## 2019-04-16 DIAGNOSIS — R232 Flushing: Secondary | ICD-10-CM

## 2019-04-16 DIAGNOSIS — E890 Postprocedural hypothyroidism: Secondary | ICD-10-CM | POA: Diagnosis not present

## 2019-04-16 DIAGNOSIS — M25562 Pain in left knee: Secondary | ICD-10-CM

## 2019-04-16 DIAGNOSIS — I10 Essential (primary) hypertension: Secondary | ICD-10-CM | POA: Diagnosis not present

## 2019-04-16 DIAGNOSIS — G8929 Other chronic pain: Secondary | ICD-10-CM

## 2019-04-16 DIAGNOSIS — R7303 Prediabetes: Secondary | ICD-10-CM

## 2019-04-16 DIAGNOSIS — M25561 Pain in right knee: Secondary | ICD-10-CM

## 2019-04-16 DIAGNOSIS — M25569 Pain in unspecified knee: Secondary | ICD-10-CM | POA: Insufficient documentation

## 2019-04-16 DIAGNOSIS — Z0000 Encounter for general adult medical examination without abnormal findings: Secondary | ICD-10-CM | POA: Diagnosis not present

## 2019-04-16 DIAGNOSIS — J309 Allergic rhinitis, unspecified: Secondary | ICD-10-CM | POA: Diagnosis not present

## 2019-04-16 DIAGNOSIS — Z1211 Encounter for screening for malignant neoplasm of colon: Secondary | ICD-10-CM

## 2019-04-16 DIAGNOSIS — Z1239 Encounter for other screening for malignant neoplasm of breast: Secondary | ICD-10-CM

## 2019-04-16 MED ORDER — MONTELUKAST SODIUM 10 MG PO TABS: 10.0000 mg | ORAL_TABLET | Freq: Every day | ORAL | 0 refills | Status: DC

## 2019-04-16 MED ORDER — VENLAFAXINE HCL ER 37.5 MG PO CP24: 37.5000 mg | ORAL_CAPSULE | Freq: Every day | ORAL | 0 refills | Status: DC

## 2019-04-16 MED ORDER — LEVOTHYROXINE SODIUM 25 MCG PO TABS: ORAL_TABLET | ORAL | 0 refills | Status: DC

## 2019-04-16 MED ORDER — LISINOPRIL 10 MG PO TABS: 10.0000 mg | ORAL_TABLET | Freq: Every day | ORAL | 3 refills | Status: DC

## 2019-04-16 MED ORDER — CELECOXIB 100 MG PO CAPS: 100.0000 mg | ORAL_CAPSULE | Freq: Two times a day (BID) | ORAL | 0 refills | Status: DC

## 2019-04-16 NOTE — Assessment & Plan Note (Signed)
Chronic and improved with OTC treatment but she cannot find. Symptoms could partially be due to untreated hypothyroidism.  Discussed options for treatment and we decided on venlafaxine ER 37.5 mg for mood and hot flashes. We discussed to wait two weeks before starting to see if thyroid treatment improved symptoms.   Follow up in 6 weeks.

## 2019-04-16 NOTE — Assessment & Plan Note (Signed)
Stable when compared to last year. Stressed the importance of diet and exercise. Continue to monitor.

## 2019-04-16 NOTE — Assessment & Plan Note (Signed)
Immunizations UTD. Pap smear UTD. Colonoscopy over due, referral to GI placed. Mammogram overdue, orders placed. Discussed the importance of a healthy diet and regular exercise in order for weight loss, and to reduce the risk of any potential medical problems. Exam stable. Labs reviewed.

## 2019-04-16 NOTE — Assessment & Plan Note (Signed)
TSH of 7 on recent labs, suspect this is secondary to post operative thyroidectomy.   Start with levothyroxine 25 mcg once daily, discussed directions for administration.  Follow up in 6 weeks for repeat TSH.

## 2019-04-16 NOTE — Assessment & Plan Note (Signed)
Borderline high today, also with elevated reading one year ago. Compliant to both lisinopril and metoprolol tartrate. Will increase lisinopril to 10 mg, continue metoprolol tartrate 25 mg BID.  She will start checking BP and send readings in 2 weeks.

## 2019-04-16 NOTE — Assessment & Plan Note (Signed)
Symptoms include scratchy throat, itchy/watery eyes, rhinorrhea. No improvement with all OTC medications, never tried Singulair. Rx for Singulair sent to pharmacy.

## 2019-04-16 NOTE — Assessment & Plan Note (Signed)
Chronic for years, history of right knee replacement in 2019. No improvement with Ibuprofen or Tylenol. She has been taking Celebrex from her husband's Rx with significant improvement.   Will send Rx for her to use PRN. She will update.

## 2019-04-16 NOTE — Patient Instructions (Addendum)
Your thyroid is underactive from the surgery. You need to take a thyroid hormone pill called levothyroxine. Take 1 tablet by mouth every morning on an empty stomach. No food or other medications for 30 minutes.   Use the celebrex pain medication as needed for knee pain. Do not take Ibuprofen or Tylenol with this medication.  We've increased your blood pressure medication, lisinopril, to 10 mg. I sent a new prescription.  You may take the venlafaxine ER 37.5 mg capsules once every morning for mood and hot flashes. Start this two weeks after your thyroid medication.  Start montelukast (Singulair) 10 mg tablets for allergies. Take this at bedtime.  You will be contacted regarding your referral to GI for the colonoscopy.  Please let us know if you have not been contacted within one week.   [facility] your [facility].  Schedule a follow up visit for 6 weeks for thyroid, blood pressure, hot flashes follow up.  It was a pleasure to see you today!   Preventive Care 40-64 Years, Female Preventive care refers to lifestyle choices and visits with your health care provider that can promote health and wellness. What does preventive care include?   A yearly physical exam. This is also called an annual well check.  Dental exams once or twice a year.  Routine eye exams. Ask your health care provider how often you should have your eyes checked.  Personal lifestyle choices, including: ? Daily care of your teeth and gums. ? Regular physical activity. ? Eating a healthy diet. ? Avoiding tobacco and drug use. ? Limiting alcohol use. ? Practicing safe sex. ? Taking low-dose aspirin daily starting at age 60. ? Taking vitamin and mineral supplements as recommended by your health care provider. What happens during an annual well check? The services and screenings done by your health care provider during your annual well check will depend on your age, overall health, lifestyle  risk factors, and family history of disease. Counseling Your health care provider may ask you questions about your:  Alcohol use.  Tobacco use.  Drug use.  Emotional well-being.  Home and relationship well-being.  Sexual activity.  Eating habits.  Work and work Statistician.  Method of birth control.  Menstrual cycle.  Pregnancy history. Screening You may have the following tests or measurements:  Height, weight, and BMI.  Blood pressure.  Lipid and cholesterol levels. These may be checked every 5 years, or more frequently if you are over 84 years old.  Skin check.  Lung cancer screening. You may have this screening every year starting at age 47 if you have a 30-pack-year history of smoking and currently smoke or have quit within the past 15 years.  Colorectal cancer screening. All adults should have this screening starting at age 32 and continuing until age 37. Your health care provider may recommend screening at age 38. You will have tests every 1-10 years, depending on your results and the type of screening test. People at increased risk should start screening at an earlier age. Screening tests may include: ? Guaiac-based fecal occult blood testing. ? Fecal immunochemical test (FIT). ? Stool DNA test. ? Virtual colonoscopy. ? Sigmoidoscopy. During this test, a flexible tube with a tiny camera (sigmoidoscope) is used to examine your rectum and lower colon. The sigmoidoscope is inserted through your anus into your rectum and lower colon. ? Colonoscopy. During this test, a long, thin, flexible tube with a tiny camera (colonoscope) is used to examine  your entire colon and rectum.  Hepatitis C blood test.  Hepatitis B blood test.  Sexually transmitted disease (STD) testing.  Diabetes screening. This is done by checking your blood sugar (glucose) after you have not eaten for a while (fasting). You may have this done every 1-3 years.  Mammogram. This may be done every  1-2 years. Talk to your health care provider about when you should start having regular mammograms. This may depend on whether you have a family history of breast cancer.  BRCA-related cancer screening. This may be done if you have a family history of breast, ovarian, tubal, or peritoneal cancers.  Pelvic exam and Pap test. This may be done every 3 years starting at age 3. Starting at age 52, this may be done every 5 years if you have a Pap test in combination with an HPV test.  Bone density scan. This is done to screen for osteoporosis. You may have this scan if you are at high risk for osteoporosis. Discuss your test results, treatment options, and if necessary, the need for more tests with your health care provider. Vaccines Your health care provider may recommend certain vaccines, such as:  Influenza vaccine. This is recommended every year.  Tetanus, diphtheria, and acellular pertussis (Tdap, Td) vaccine. You may need a Td booster every 10 years.  Varicella vaccine. You may need this if you have not been vaccinated.  Zoster vaccine. You may need this after age 38.  Measles, mumps, and rubella (MMR) vaccine. You may need at least one dose of MMR if you were born in 1957 or later. You may also need a second dose.  Pneumococcal 13-valent conjugate (PCV13) vaccine. You may need this if you have certain conditions and were not previously vaccinated.  Pneumococcal polysaccharide (PPSV23) vaccine. You may need one or two doses if you smoke cigarettes or if you have certain conditions.  Meningococcal vaccine. You may need this if you have certain conditions.  Hepatitis A vaccine. You may need this if you have certain conditions or if you travel or work in places where you may be exposed to hepatitis A.  Hepatitis B vaccine. You may need this if you have certain conditions or if you travel or work in places where you may be exposed to hepatitis B.  Haemophilus influenzae type b (Hib)  vaccine. You may need this if you have certain conditions. Talk to your health care provider about which screenings and vaccines you need and how often you need them. This information is not intended to replace advice given to you by your health care provider. Make sure you discuss any questions you have with your health care provider. Document Released: 11/12/2015 Document Revised: 12/06/2017 Document Reviewed: 08/17/2015 Elsevier Interactive Patient Education  2019 [employer].

## 2019-04-16 NOTE — Progress Notes (Signed)
Subjective:    Patient ID: Hannah Fritz, female    DOB: 06-27-1966, 53 y.o.   MRN: [id number]  HPI  Ms. Hannah Fritz is a 53 year old female who presents today for complete physical. She is also wanting to discuss treatment for hot flashes, chronic knee pain and allergies.   Also labs with hypothyroidism, she is not currently treated. She is post op partial thyroidectomy 1-2 years ago.  See ROS notes.  Immunizations: -Tetanus: Completed in 2018 -Influenza: Due this season   Diet: She endorses a fair diet. Has recently started eating healthier recently this week. Prior to this week she was eating fast food/take out food. She is drinking water and diet soda.  Exercise: She is not exercising  Eye exam: No recent exam Dental exam: Completes annually  Colonoscopy: Never completed, referral placed Pap Smear: Completed in 2018, due in 2021 Mammogram: No recent mammogram  BP Readings from Last 3 Encounters:  04/16/19 134/80  03/20/18 140/84  01/18/17 120/78     Review of Systems  Constitutional: Positive for fatigue. Negative for unexpected weight change.  HENT: Positive for postnasal drip and rhinorrhea.   Eyes: Positive for itching.  Respiratory: Negative for cough and shortness of breath.   Cardiovascular: Negative for chest pain.  Gastrointestinal: Negative for constipation and diarrhea.  Genitourinary: Negative for difficulty urinating.       Daily hot flashes, taking Estroven with improvement but is having trouble obtaining it OTC. She would like to try something prescription strength, has never tried before.  Musculoskeletal: Positive for arthralgias.       Chronic bilateral knee pain for years, history of right knee replacement, is needing left done soon. No improvement with Tylenol or Ibuprofen. She did take some of her husbands Celebrex with improvement. She would like a prescription of her own until she can inquire about her left knee replacement.   Skin: Negative for  rash.  Allergic/Immunologic: Positive for environmental allergies.       Chronic allergies with symptoms of itchy/water eyes, rhinorrhea, etc. No improvement with numerous OTC treatments. Cannot tolerate nasal sprays. Would like Rx strength treatment.  Neurological: Negative for dizziness, numbness and headaches.  Psychiatric/Behavioral: The patient is not nervous/anxious.        Past Medical History:  Diagnosis Date  . Hypertension   . Hyperthyroidism      Social History   Socioeconomic History  . Marital status: Single    Spouse name: Not on file  . Number of children: Not on file  . Years of education: Not on file  . Highest education level: Not on file  Occupational History  . Not on file  Social Needs  . Financial resource strain: Not on file  . Food insecurity    Worry: Not on file    Inability: Not on file  . Transportation needs    Medical: Not on file    Non-medical: Not on file  Tobacco Use  . Smoking status: Never Smoker  . Smokeless tobacco: Never Used  Substance and Sexual Activity  . Alcohol use: Yes  . Drug use: Not on file  . Sexual activity: Not on file  Lifestyle  . Physical activity    Days per week: Not on file    Minutes per session: Not on file  . Stress: Not on file  Relationships  . Social Herbalist on phone: Not on file    Gets together: Not on file  Attends religious service: Not on file    Active member of club or organization: Not on file    Attends meetings of clubs or organizations: Not on file    Relationship status: Not on file  . Intimate partner violence    Fear of current or ex partner: Not on file    Emotionally abused: Not on file    Physically abused: Not on file    Forced sexual activity: Not on file  Other Topics Concern  . Not on file  Social History Narrative   Married.   3 children. 5 grandchildren.   Works as a Agricultural engineer.   Enjoys shopping, reading, spending time with family.      Family  History  Problem Relation Age of Onset  . Arthritis Father   . Hypertension Paternal Grandmother   . Hypertension Paternal Grandfather     Allergies  Allergen Reactions  . Sulfa Antibiotics Hives    Current Outpatient Medications on File Prior to Visit  Medication Sig Dispense Refill  . metoprolol tartrate (LOPRESSOR) 25 MG tablet TAKE 1 TABLET(25 MG TOTAL) BY MOUTH 2(TWO) TIMES DAILY FOR BLOOD PRESSURE. 60 tablet 0   No current facility-administered medications on file prior to visit.     BP 134/80   Pulse 69   Temp 97.9 F (36.6 C) (Tympanic)   Ht 5' 1.75" (1.568 m)   Wt 249 lb (112.9 kg)   SpO2 97%   BMI 45.91 kg/m    Objective:   Physical Exam  Constitutional: She is oriented to person, place, and time. She appears well-nourished.  HENT:  Mouth/Throat: No oropharyngeal exudate.  Eyes: Pupils are equal, round, and reactive to light. EOM are normal.  Neck: Neck supple. No thyromegaly present.  Cardiovascular: Normal rate and regular rhythm.  Respiratory: Effort normal and breath sounds normal.  GI: Soft. Bowel sounds are normal. There is no abdominal tenderness.  Musculoskeletal:     Left knee: She exhibits decreased range of motion. She exhibits no swelling. No tenderness found.     Comments: Chronic bilateral knee pain with decrease in ROM to left.  Neurological: She is alert and oriented to person, place, and time.  Skin: Skin is warm and dry.  Psychiatric: She has a normal mood and affect.           Assessment & Plan:

## 2019-04-21 ENCOUNTER — Encounter: Payer: Self-pay | Admitting: Gastroenterology

## 2019-04-21 NOTE — Telephone Encounter (Signed)
Spoken and notified patient of Kate Clark's comments. Patient verbalized understanding.  

## 2019-04-21 NOTE — Telephone Encounter (Signed)
Called pt about her GI referral.  Pt wanted to let you know she recalled she was taking Celebrex 200mg  vs 100mg .  She was taking (1) one bid and that really worked for her. She has already picked up the 100mg  that were just prescribed.  She wants to know should she take (2) two bid?

## 2019-04-21 NOTE — Telephone Encounter (Signed)
Have her start with 1 capsule BID, then can go up to 2 BID if needed. Have her update me. The goal is to use the least amount that will control her symptoms.

## 2019-04-29 ENCOUNTER — Other Ambulatory Visit: Payer: Self-pay | Admitting: Primary Care

## 2019-04-29 DIAGNOSIS — I10 Essential (primary) hypertension: Secondary | ICD-10-CM

## 2019-05-08 ENCOUNTER — Other Ambulatory Visit: Payer: Self-pay | Admitting: Primary Care

## 2019-05-08 ENCOUNTER — Other Ambulatory Visit: Payer: Self-pay | Admitting: Internal Medicine

## 2019-05-08 DIAGNOSIS — Z1231 Encounter for screening mammogram for malignant neoplasm of breast: Secondary | ICD-10-CM

## 2019-05-13 ENCOUNTER — Other Ambulatory Visit: Payer: Self-pay

## 2019-05-13 ENCOUNTER — Ambulatory Visit: Payer: BC Managed Care – PPO | Admitting: *Deleted

## 2019-05-13 VITALS — Ht 62.0 in | Wt 230.0 lb

## 2019-05-13 DIAGNOSIS — Z1211 Encounter for screening for malignant neoplasm of colon: Secondary | ICD-10-CM

## 2019-05-13 MED ORDER — NA SULFATE-K SULFATE-MG SULF 17.5-3.13-1.6 GM/177ML PO SOLN: 1.0000 | Freq: Once | ORAL | 0 refills | Status: AC

## 2019-05-13 NOTE — Progress Notes (Signed)

## 2019-05-26 ENCOUNTER — Telehealth: Payer: Self-pay | Admitting: Gastroenterology

## 2019-05-26 NOTE — Telephone Encounter (Signed)

## 2019-05-27 ENCOUNTER — Other Ambulatory Visit: Payer: Self-pay

## 2019-05-27 ENCOUNTER — Ambulatory Visit (AMBULATORY_SURGERY_CENTER): Payer: BC Managed Care – PPO | Admitting: Gastroenterology

## 2019-05-27 ENCOUNTER — Other Ambulatory Visit: Payer: Self-pay | Admitting: Primary Care

## 2019-05-27 ENCOUNTER — Encounter: Payer: Self-pay | Admitting: Gastroenterology

## 2019-05-27 VITALS — BP 135/70 | HR 64 | Temp 97.6°F | Resp 14 | Ht 62.0 in | Wt 230.0 lb

## 2019-05-27 DIAGNOSIS — D122 Benign neoplasm of ascending colon: Secondary | ICD-10-CM

## 2019-05-27 DIAGNOSIS — D123 Benign neoplasm of transverse colon: Secondary | ICD-10-CM

## 2019-05-27 DIAGNOSIS — Z1211 Encounter for screening for malignant neoplasm of colon: Secondary | ICD-10-CM

## 2019-05-27 DIAGNOSIS — I10 Essential (primary) hypertension: Secondary | ICD-10-CM

## 2019-05-27 MED ORDER — SODIUM CHLORIDE 0.9 % IV SOLN: 500.0000 mL | Freq: Once | INTRAVENOUS | Status: DC

## 2019-05-27 NOTE — Op Note (Signed)
[facility] Patient Name: Hannah Fritz Procedure Date: 05/27/2019 8:52 AM MRN: [id number] Endoscopist: [place]omp[place]. Loletha Carrow , MD Age: 53 Referring MD:  Date of Birth: 04/18/1966 Gender: Female Account #: [id number] Procedure:                Colonoscopy Indications:              Screening for colorectal malignant neoplasm, This                            is the patient's first colonoscopy Medicines:                Monitored Anesthesia Care Procedure:                Pre-Anesthesia Assessment:                           - Prior to the procedure, a History and Physical                            was performed, and patient medications and                            allergies were reviewed. The patient's tolerance of                            previous anesthesia was also reviewed. The risks                            and benefits of the procedure and the sedation                            options and risks were discussed with the patient.                            All questions were answered, and informed consent                            was obtained. Prior Anticoagulants: The patient has                            taken no previous anticoagulant or antiplatelet                            agents. ASA Grade Assessment: II - A patient with                            mild systemic disease. After reviewing the risks                            and benefits, the patient was deemed in                            satisfactory condition to undergo the procedure.  After obtaining informed consent, the colonoscope                            was passed under direct vision. Throughout the                            procedure, the patient's blood pressure, pulse, and                            oxygen saturations were monitored continuously. The                            Colonoscope was introduced through the anus and                            advanced to the the cecum,  identified by                            appendiceal orifice and ileocecal valve. The                            colonoscopy was performed without difficulty. The                            patient tolerated the procedure well. The quality                            of the bowel preparation was good. The ileocecal                            valve, appendiceal orifice, and rectum were                            photographed. Scope In: 8:56:34 AM Scope Out: 9:10:10 AM Scope Withdrawal Time: 0 hours 9 minutes 52 seconds  Total Procedure Duration: 0 hours 13 minutes 36 seconds  Findings:                 The perianal and digital rectal examinations were                            normal.                           Two sessile polyps were found in the transverse                            colon and ascending colon. The polyps were 4 mm in                            size. These polyps were removed with a cold snare.                            Resection and retrieval were complete.  The exam was otherwise without abnormality on                            direct and retroflexion views. Complications:            No immediate complications. Estimated Blood Loss:     Estimated blood loss was minimal. Impression:               - Two 4 mm polyps in the transverse colon and in                            the ascending colon, removed with a cold snare.                            Resected and retrieved.                           - The examination was otherwise normal on direct                            and retroflexion views. Recommendation:           - Patient has a contact number available for                            emergencies. The signs and symptoms of potential                            delayed complications were discussed with the                            patient. Return to normal activities tomorrow.                            Written discharge instructions were  provided to the                            patient.                           - Resume previous diet.                           - Continue present medications.                           - Await pathology results.                           - Repeat colonoscopy is recommended for                            surveillance. The colonoscopy date will be                            determined after pathology results from today's  exam become available for review. Henry L. Loletha Carrow, MD 05/27/2019 9:16:07 AM This report has been signed electronically.

## 2019-05-27 NOTE — Progress Notes (Signed)
Pt's states no medical or surgical changes since previsit or office visit. 

## 2019-05-27 NOTE — Patient Instructions (Signed)
Handout given : Polyps   YOU HAD AN ENDOSCOPIC PROCEDURE TODAY AT [facility]:   Refer to the procedure report that was given to you for any specific questions about what was found during the examination.  If the procedure report does not answer your questions, please call your gastroenterologist to clarify.  If you requested that your care partner not be given the details of your procedure findings, then the procedure report has been included in a sealed envelope for you to review at your convenience later.  YOU SHOULD EXPECT: Some feelings of bloating in the abdomen. Passage of more gas than usual.  Walking can help get rid of the air that was put into your GI tract during the procedure and reduce the bloating. If you had a lower endoscopy (such as a colonoscopy or flexible sigmoidoscopy) you may notice spotting of blood in your stool or on the toilet paper. If you underwent a bowel prep for your procedure, you may not have a normal bowel movement for a few days.  Please Note:  You might notice some irritation and congestion in your nose or some drainage.  This is from the oxygen used during your procedure.  There is no need for concern and it should clear up in a day or so.  SYMPTOMS TO REPORT IMMEDIATELY:   Following lower endoscopy (colonoscopy or flexible sigmoidoscopy):  Excessive amounts of blood in the stool  Significant tenderness or worsening of abdominal pains  Swelling of the abdomen that is new, acute  Fever of 100F or higher   For urgent or emergent issues, a gastroenterologist can be reached at any hour by calling [phone].   DIET:  We do recommend a small meal at first, but then you may proceed to your regular diet.  Drink plenty of fluids but you should avoid alcoholic beverages for 24 hours.  ACTIVITY:  You should plan to take it easy for the rest of today and you should NOT DRIVE or use heavy machinery until tomorrow (because of the sedation  medicines used during the test).    FOLLOW UP: Our staff will call the number listed on your records 48-72 hours following your procedure to check on you and address any questions or concerns that you may have regarding the information given to you following your procedure. If we do not reach you, we will leave a message.  We will attempt to reach you two times.  During this call, we will ask if you have developed any symptoms of COVID 19. If you develop any symptoms (ie: fever, flu-like symptoms, shortness of breath, cough etc.) before then, please call [phone].  If you test positive for Covid 19 in the 2 weeks post procedure, please call and report this information to [place].    If any biopsies were taken you will be contacted by phone or by letter within the next 1-3 weeks.  Please call us at [phone] if you have not heard about the biopsies in 3 weeks.    SIGNATURES/CONFIDENTIALITY: You and/or your care partner have signed paperwork which will be entered into your electronic medical record.  These signatures attest to the fact that that the information above on your After Visit Summary has been reviewed and is understood.  Full responsibility of the confidentiality of this discharge information lies with you and/or your care-partner.

## 2019-05-27 NOTE — Progress Notes (Signed)
Pt Drowsy. VSS. To PACU, report to RN. No anesthetic complications noted.  

## 2019-05-28 ENCOUNTER — Encounter: Payer: Self-pay | Admitting: Primary Care

## 2019-05-28 ENCOUNTER — Ambulatory Visit (INDEPENDENT_AMBULATORY_CARE_PROVIDER_SITE_OTHER): Payer: BC Managed Care – PPO | Admitting: Primary Care

## 2019-05-28 ENCOUNTER — Other Ambulatory Visit: Payer: Self-pay | Admitting: Primary Care

## 2019-05-28 VITALS — BP 126/82 | HR 72 | Temp 98.0°F | Ht 62.0 in | Wt 251.2 lb

## 2019-05-28 DIAGNOSIS — M25561 Pain in right knee: Secondary | ICD-10-CM

## 2019-05-28 DIAGNOSIS — I10 Essential (primary) hypertension: Secondary | ICD-10-CM | POA: Diagnosis not present

## 2019-05-28 DIAGNOSIS — E890 Postprocedural hypothyroidism: Secondary | ICD-10-CM

## 2019-05-28 DIAGNOSIS — M25562 Pain in left knee: Secondary | ICD-10-CM

## 2019-05-28 DIAGNOSIS — R232 Flushing: Secondary | ICD-10-CM

## 2019-05-28 DIAGNOSIS — G8929 Other chronic pain: Secondary | ICD-10-CM

## 2019-05-28 LAB — BASIC METABOLIC PANEL
BUN: 10 mg/dL (ref 6–23)
CO2: 27 mEq/L (ref 19–32)
Calcium: 9.2 mg/dL (ref 8.4–10.5)
Chloride: 104 mEq/L (ref 96–112)
Creatinine, Ser: 0.72 mg/dL (ref 0.40–1.20)
GFR: 84.62 mL/min (ref >60.00)
Glucose, Bld: 115 mg/dL — ABNORMAL HIGH (ref 70–99)
Potassium: 4.1 mEq/L (ref 3.5–5.1)
Sodium: 138 mEq/L (ref 135–145)

## 2019-05-28 LAB — TSH: TSH: 4.69 u[IU]/mL — ABNORMAL HIGH (ref 0.35–4.50)

## 2019-05-28 MED ORDER — LEVOTHYROXINE SODIUM 50 MCG PO TABS: ORAL_TABLET | ORAL | 0 refills | Status: DC

## 2019-05-28 MED ORDER — CELECOXIB 200 MG PO CAPS: 200.0000 mg | ORAL_CAPSULE | Freq: Two times a day (BID) | ORAL | 0 refills | Status: DC

## 2019-05-28 NOTE — Assessment & Plan Note (Signed)
Improved on increased dose of lisinopril 10 mg. Continue both metoprolol tartrate and lisinopril 10 mg.  BMP pending.

## 2019-05-28 NOTE — Assessment & Plan Note (Signed)
Discussed to take levothyroxine with water only, no diet soda or other drinks. Otherwise she is taking appropriately.   Repeat TSH pending. Continue levothyroxine 25 mcg for now, may need dose adjustment.

## 2019-05-28 NOTE — Patient Instructions (Signed)
Stop by the lab prior to leaving today. I will notify you of your results once received.   Be sure to take your levothyroxine (thyroid medication) every morning on an empty stomach with water only. No food or other medications for 30 minutes. No heartburn medication, iron pills, calcium, vitamin D, or magnesium pills within four hours of taking levothyroxine.   We've increased your celebrex dose to 200 mg twice daily as needed for pain. Schedule a follow up visit with orthopedics as discussed.  It was a pleasure to see you today!

## 2019-05-28 NOTE — Assessment & Plan Note (Signed)
Never took venlafaxine, resolved after initiating levothyroxine. Repeat TSH pending.

## 2019-05-28 NOTE — Progress Notes (Signed)
Subjective:    Patient ID: Hannah Fritz, female    DOB: April 05, 1966, 53 y.o.   MRN: [id number]  HPI  Hannah Fritz is a 53 year old female who presents today for follow up.  1) Hypothyroidism: Currently managed on levothyroxine 25 mcg tablets. Her last TSH was 7.12 in June 2020 so levothyroxine 25 mcg was initiated. We discussed the proper administration for levothyroxine at that time.  She is taking her levothyroxine every morning with water or diet soda and is waiting 30 minutes prior to eating and taking other medications. She endorses feeling better since initiation of levothyroxine.   2) Hot Flashes: Chronic. Evaluated in mid June 2020 and endorsed improvement with OTC products but couldn't find at the time. Given continued symptoms we initiated venlafaxine ER 37.5 mg daily.   Since her last visit her hot flashes have resolved. She never started venlafaxine as her hot flashes resolved after taking levothyroxine.   3) Essential Hypertension: Chronic and borderline high during her last visit in June 2020. She was compliant to her lisinopril and metoprolol tartrate 25 mg BID so her lisinopril was increased to 10 mg.  Since her last visit she denies dizziness and chest pain.   4) Chronic Knee Pain: Continued and chronic to right knee. Planning on scheduling an appointment with orthopedics for August 2020. Feels better when taking Celebrex 200 mg BID, would like her Rx changed. She is not exercising much due to pain.  BP Readings from Last 3 Encounters:  05/28/19 126/82  05/27/19 135/70  04/16/19 134/80     Review of Systems  Eyes: Negative for visual disturbance.  Respiratory: Negative for shortness of breath.   Cardiovascular: Negative for chest pain.  Genitourinary:       Denies hot flahes  Neurological: Negative for dizziness and headaches.       Past Medical History:  Diagnosis Date  . Allergy   . Arthritis    knee  . Chronic allergic rhinitis   . Hot flashes   .  Hypertension   . Hyperthyroidism   . Post-operative hypothyroidism   . Prediabetes      Social History   Socioeconomic History  . Marital status: Single    Spouse name: Not on file  . Number of children: Not on file  . Years of education: Not on file  . Highest education level: Not on file  Occupational History  . Not on file  Social Needs  . Financial resource strain: Not on file  . Food insecurity    Worry: Not on file    Inability: Not on file  . Transportation needs    Medical: Not on file    Non-medical: Not on file  Tobacco Use  . Smoking status: Never Smoker  . Smokeless tobacco: Never Used  Substance and Sexual Activity  . Alcohol use: Not Currently  . Drug use: Never  . Sexual activity: Not on file  Lifestyle  . Physical activity    Days per week: Not on file    Minutes per session: Not on file  . Stress: Not on file  Relationships  . Social Herbalist on phone: Not on file    Gets together: Not on file    Attends religious service: Not on file    Active member of club or organization: Not on file    Attends meetings of clubs or organizations: Not on file    Relationship status: Not on file  .  Intimate partner violence    Fear of current or ex partner: Not on file    Emotionally abused: Not on file    Physically abused: Not on file    Forced sexual activity: Not on file  Other Topics Concern  . Not on file  Social History Narrative   Married.   3 children. 5 grandchildren.   Works as a Agricultural engineer.   Enjoys shopping, reading, spending time with family.    Past Surgical History:  Procedure Laterality Date  . REPLACEMENT TOTAL KNEE     right  . THYROIDECTOMY, PARTIAL    . TUBAL LIGATION  1995    Family History  Problem Relation Age of Onset  . Arthritis Father   . Hypertension Paternal Grandmother   . Colon polyps Paternal Grandmother   . Hypertension Paternal Grandfather   . Colon cancer Neg Hx   . Esophageal cancer Neg Hx   .  Stomach cancer Neg Hx   . Rectal cancer Neg Hx     Allergies  Allergen Reactions  . Sulfa Antibiotics Hives    Current Outpatient Medications on File Prior to Visit  Medication Sig Dispense Refill  . Biotin 5000 MCG TABS Take by mouth daily.    . celecoxib (CELEBREX) 100 MG capsule Take 1 capsule (100 mg total) by mouth 2 (two) times daily. As needed for pain. 180 capsule 0  . levothyroxine (SYNTHROID) 25 MCG tablet Take 1 tablet by mouth every morning on an empty stomach. No food or other medications for 30 minutes. 90 tablet 0  . lisinopril (ZESTRIL) 10 MG tablet Take 1 tablet (10 mg total) by mouth daily. For blood pressure. 90 tablet 3  . metoprolol tartrate (LOPRESSOR) 25 MG tablet TAKE 1 TABLET(25 MG) BY MOUTH TWICE DAILY FOR BLOOD PRESSURE 60 tablet 2  . montelukast (SINGULAIR) 10 MG tablet Take 1 tablet (10 mg total) by mouth at bedtime. For allergies. (Patient not taking: Reported on 05/28/2019) 90 tablet 0   No current facility-administered medications on file prior to visit.     BP 126/82   Pulse 72   Temp 98 F (36.7 C) (Temporal)   Ht 5\' 2"  (1.575 m)   Wt 251 lb 4 oz (114 kg)   SpO2 98%   BMI 45.95 kg/m    Objective:   Physical Exam  Constitutional: She appears well-nourished.  Neck: Neck supple.  Cardiovascular: Normal rate and regular rhythm.  Respiratory: Effort normal and breath sounds normal.  Skin: Skin is warm and dry.  Psychiatric: She has a normal mood and affect.           Assessment & Plan:

## 2019-05-28 NOTE — Assessment & Plan Note (Signed)
Slightly improved with Celebrex 100 mg BID, has taken 200 mg BID and done better. Changed Rx to 200 mg BID, she will be scheduling with orthopedics next month.

## 2019-05-29 ENCOUNTER — Telehealth: Payer: Self-pay

## 2019-05-29 NOTE — Telephone Encounter (Signed)
  Follow up Call-  Call back number 05/27/2019  Post procedure Call Back phone  # [phone]  Permission to leave phone message Yes  Some recent data might be hidden     Patient questions:  Do you have a fever, pain , or abdominal swelling? No. Pain Score  0 *  Have you tolerated food without any problems? Yes.    Have you been able to return to your normal activities? Yes.    Do you have any questions about your discharge instructions: Diet   No. Medications  No. Follow up visit  No.  Do you have questions or concerns about your Care? No.  Actions: * If pain score is 4 or above: 1. No action needed, pain <4.Have you developed a fever since your procedure? no  2.   Have you had an respiratory symptoms (SOB or cough) since your procedure? no  3.   Have you tested positive for COVID 19 since your procedure no  4.   Have you had any family members/close contacts diagnosed with the COVID 19 since your procedure?  no   If yes to any of these questions please route to Joylene John, RN and Alphonsa Gin, Therapist, sports.

## 2019-06-02 ENCOUNTER — Encounter: Payer: Self-pay | Admitting: Gastroenterology

## 2019-06-15 ENCOUNTER — Other Ambulatory Visit: Payer: Self-pay | Admitting: Primary Care

## 2019-06-15 DIAGNOSIS — E890 Postprocedural hypothyroidism: Secondary | ICD-10-CM

## 2019-06-20 ENCOUNTER — Other Ambulatory Visit: Payer: Self-pay

## 2019-06-20 ENCOUNTER — Ambulatory Visit
Admission: RE | Admit: 2019-06-20 | Discharge: 2019-06-20 | Disposition: A | Discharge status: Home / Self Care | Payer: BC Managed Care – PPO | Source: Ambulatory Visit | Attending: Primary Care | Admitting: Primary Care

## 2019-06-20 DIAGNOSIS — Z1231 Encounter for screening mammogram for malignant neoplasm of breast: Secondary | ICD-10-CM | POA: Diagnosis not present

## 2019-06-25 ENCOUNTER — Other Ambulatory Visit: Payer: Self-pay | Admitting: Primary Care

## 2019-06-25 DIAGNOSIS — R928 Other abnormal and inconclusive findings on diagnostic imaging of breast: Secondary | ICD-10-CM

## 2019-06-27 ENCOUNTER — Other Ambulatory Visit: Payer: Self-pay | Admitting: Primary Care

## 2019-06-27 ENCOUNTER — Ambulatory Visit
Admission: RE | Admit: 2019-06-27 | Discharge: 2019-06-27 | Disposition: A | Discharge status: Home / Self Care | Payer: BC Managed Care – PPO | Source: Ambulatory Visit | Attending: Primary Care | Admitting: Primary Care

## 2019-06-27 ENCOUNTER — Other Ambulatory Visit: Payer: Self-pay

## 2019-06-27 DIAGNOSIS — R928 Other abnormal and inconclusive findings on diagnostic imaging of breast: Secondary | ICD-10-CM

## 2019-06-27 DIAGNOSIS — N6323 Unspecified lump in the left breast, lower outer quadrant: Secondary | ICD-10-CM | POA: Diagnosis not present

## 2019-06-27 DIAGNOSIS — N6320 Unspecified lump in the left breast, unspecified quadrant: Secondary | ICD-10-CM

## 2019-07-11 ENCOUNTER — Other Ambulatory Visit (INDEPENDENT_AMBULATORY_CARE_PROVIDER_SITE_OTHER): Payer: BC Managed Care – PPO

## 2019-07-11 DIAGNOSIS — E890 Postprocedural hypothyroidism: Secondary | ICD-10-CM

## 2019-07-11 LAB — TSH: TSH: 3.38 u[IU]/mL (ref 0.35–4.50)

## 2019-07-18 ENCOUNTER — Other Ambulatory Visit: Payer: Self-pay | Admitting: Primary Care

## 2019-07-18 DIAGNOSIS — J309 Allergic rhinitis, unspecified: Secondary | ICD-10-CM

## 2019-07-18 DIAGNOSIS — E890 Postprocedural hypothyroidism: Secondary | ICD-10-CM

## 2019-07-18 DIAGNOSIS — R232 Flushing: Secondary | ICD-10-CM

## 2019-07-21 ENCOUNTER — Telehealth: Payer: Self-pay | Admitting: Primary Care

## 2019-07-21 DIAGNOSIS — E890 Postprocedural hypothyroidism: Secondary | ICD-10-CM

## 2019-07-21 MED ORDER — LEVOTHYROXINE SODIUM 50 MCG PO TABS: ORAL_TABLET | ORAL | 0 refills | Status: DC

## 2019-07-21 NOTE — Telephone Encounter (Signed)
Patient went to pick up her thyroid medication at the pharmacy yesterday.  The pharmacy told her it had been denied because her dosage was increased.  Patient said she did get the 50 filled before.  Patient wants the rx sent to [facility] in [place].  Patient will be out of her medication tomorrow.

## 2019-07-21 NOTE — Telephone Encounter (Signed)
Spoken to patient and inform that 25 mcg was denied but not the 50 mcg.  I have sent the 50 mcg to [facility]. Patient have been notified.

## 2019-09-02 ENCOUNTER — Other Ambulatory Visit: Payer: Self-pay | Admitting: Primary Care

## 2019-09-02 DIAGNOSIS — G8929 Other chronic pain: Secondary | ICD-10-CM

## 2019-09-02 NOTE — Telephone Encounter (Signed)
Last prescribed on 05/28/2019 . Last appointment on 05/28/2019. No future appointment

## 2019-09-02 NOTE — Telephone Encounter (Signed)
Is she seeing orthopedics now for her knee? What's the plan?  Does she need a refill of her Celebrex?

## 2019-09-04 NOTE — Telephone Encounter (Signed)
Message left for patient to return my call.  

## 2019-09-05 MED ORDER — CELECOXIB 200 MG PO CAPS: 200.0000 mg | ORAL_CAPSULE | Freq: Every day | ORAL | 1 refills | Status: DC

## 2019-09-05 NOTE — Telephone Encounter (Signed)
Noted, refill sent to pharmacy. 

## 2019-09-05 NOTE — Telephone Encounter (Signed)
Spoken to patient and she stated that she did orthopedics but nothing was plan except follow up in a year.  Patient stated that she needs a refill and also she is only taking this 1 tablet once a day instead.

## 2019-10-15 ENCOUNTER — Other Ambulatory Visit: Payer: Self-pay | Admitting: Primary Care

## 2019-10-15 DIAGNOSIS — E890 Postprocedural hypothyroidism: Secondary | ICD-10-CM

## 2019-12-08 IMAGING — MG DIGITAL DIAGNOSTIC UNILATERAL LEFT MAMMOGRAM WITH TOMO AND CAD
4 series · 4 of 12 positions shown · non-contrast
Comparison: Previous exam(s).

CLINICAL DATA: Patient recalled from screening for a possible left
breast mass.

EXAM:
DIGITAL DIAGNOSTIC LEFT MAMMOGRAM WITH CAD AND TOMO
ULTRASOUND LEFT BREAST

[L MLO synth-2D]
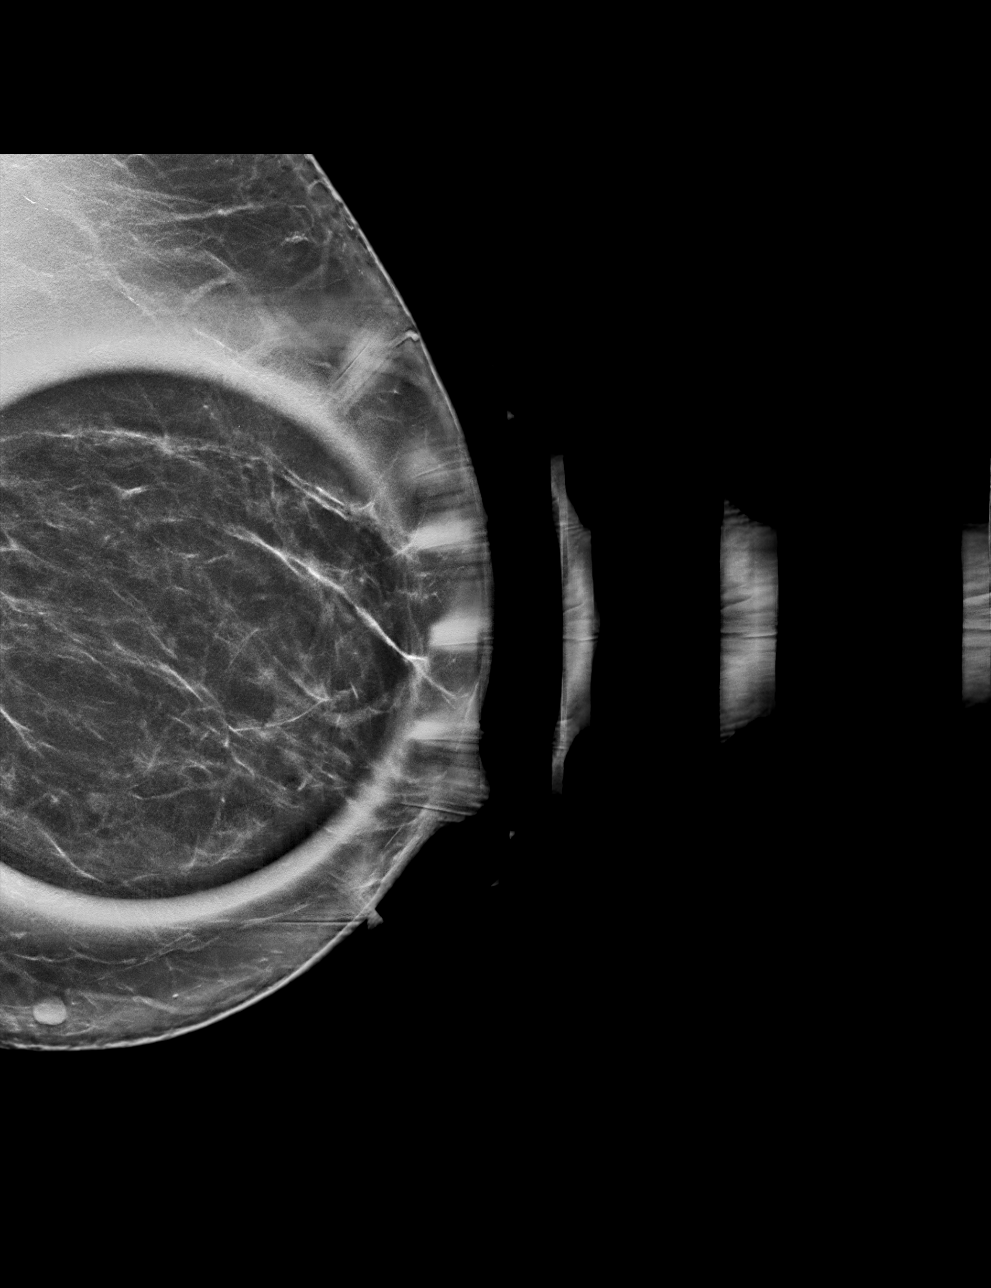

[L CC synth-2D]
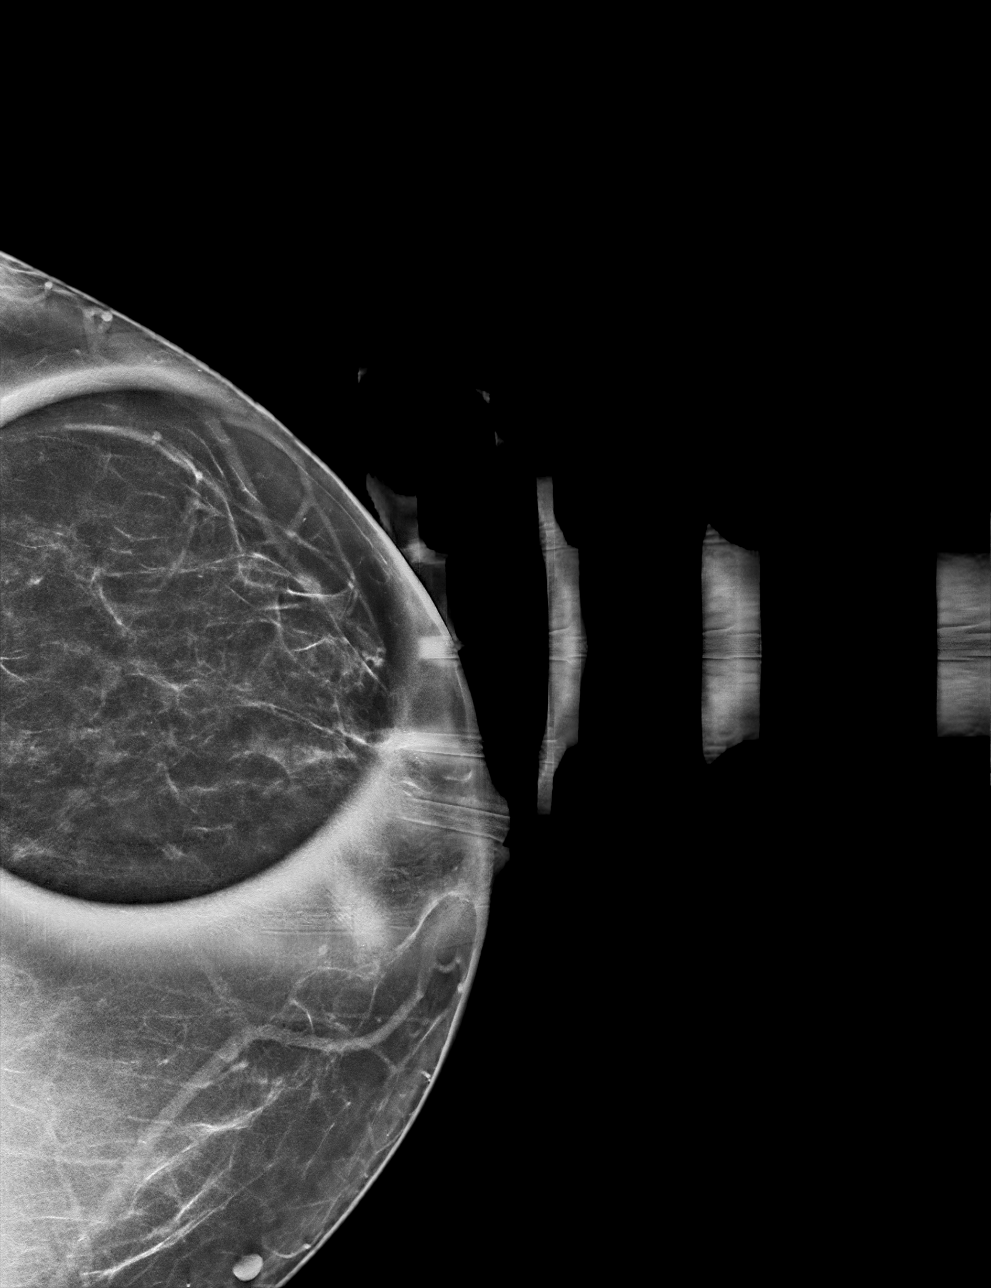

[L MLO tomo · tomo slice 42/83.0]
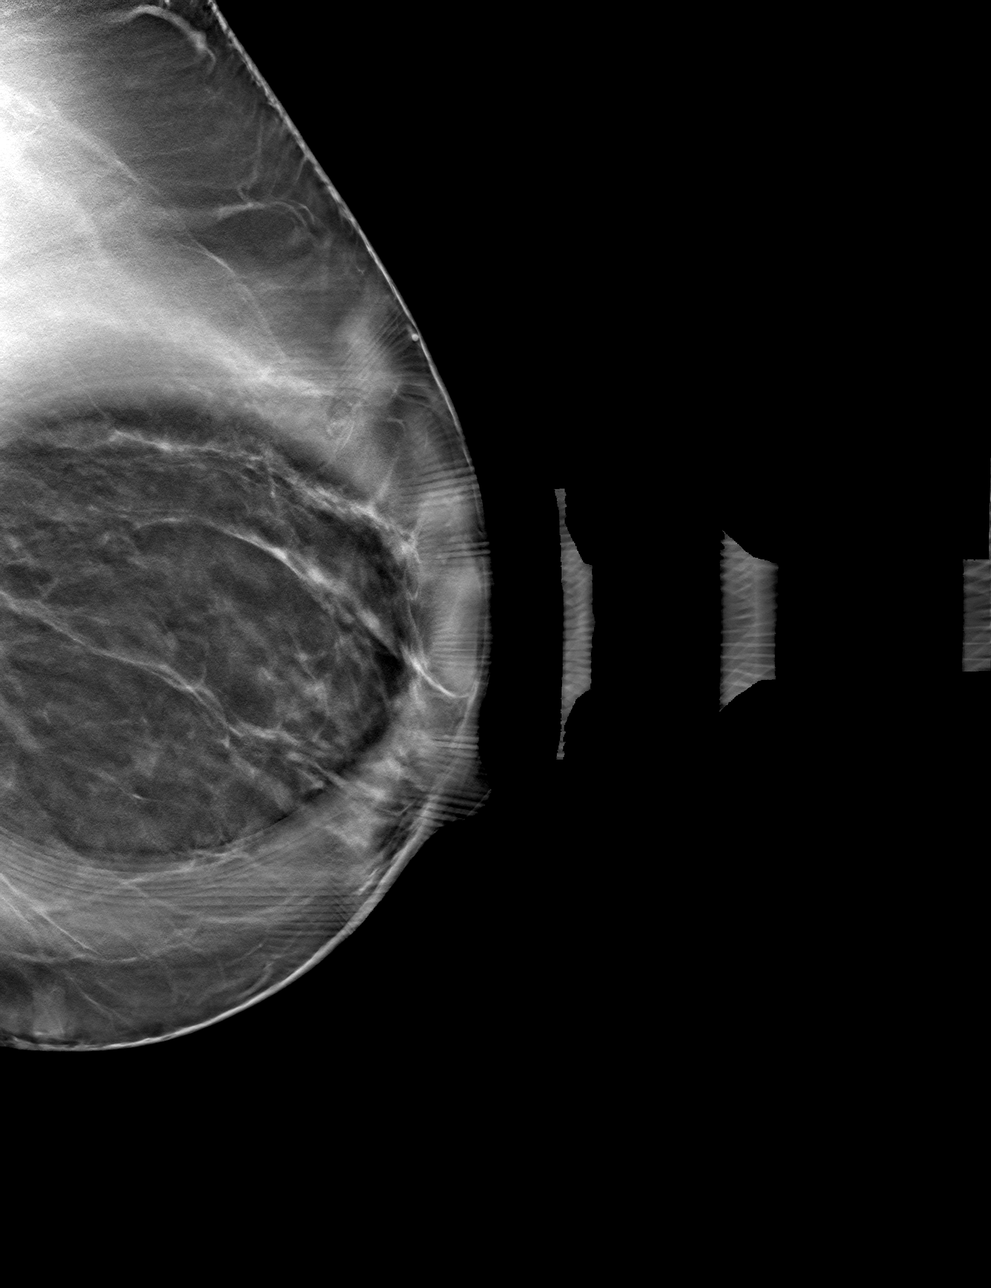

[L CC tomo · tomo slice 35/70.0]
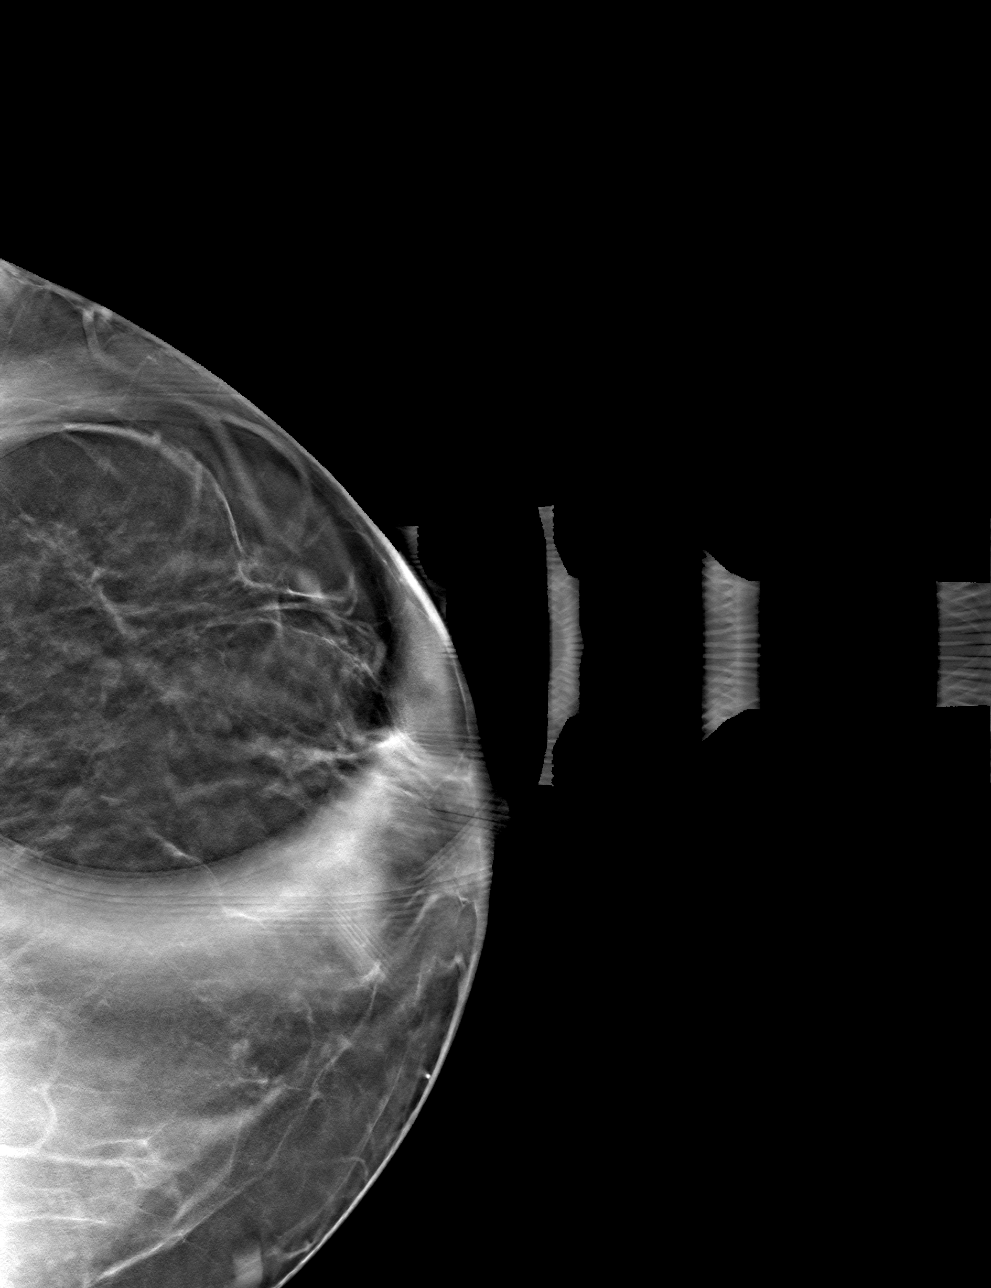

[4 of 12 positions shown; findings below may reference images not displayed]

ACR Breast Density Category b: There are scattered areas of
fibroglandular density.
FINDINGS: Mammogram: Additional diagnostic views including spot compression
demonstrate persistence of a small 4 mm oval mass in the lower outer
left breast.

Mammographic images were processed with CAD.

Targeted ultrasound is performed, showing an oval hypoechoic mass at
4 o'clock 3 cm from the nipple most likely representing a
complicated cyst. This measures 3 x 2 x 3 mm.
IMPRESSION: Left breast mass at 4 o'clock measuring 4 mm is probably benign,
likely a complicated cyst.

RECOMMENDATION:
Six-month follow-up left breast ultrasound is recommended for the
mass at 4 o'clock.

I have discussed the findings and recommendations with the patient.
Results were also provided in writing at the conclusion of the
visit. If applicable, a reminder letter will be sent to the patient
regarding the next appointment.

BI-RADS CATEGORY  3: Probably benign.

## 2019-12-29 ENCOUNTER — Encounter: Payer: Self-pay | Admitting: Family Medicine

## 2019-12-29 ENCOUNTER — Other Ambulatory Visit: Payer: Self-pay

## 2019-12-29 ENCOUNTER — Ambulatory Visit
Admission: RE | Admit: 2019-12-29 | Discharge: 2019-12-29 | Disposition: A | Discharge status: Home / Self Care | Payer: BC Managed Care – PPO | Source: Ambulatory Visit | Attending: Primary Care | Admitting: Primary Care

## 2019-12-29 ENCOUNTER — Other Ambulatory Visit: Payer: Self-pay | Admitting: Primary Care

## 2019-12-29 DIAGNOSIS — N6320 Unspecified lump in the left breast, unspecified quadrant: Secondary | ICD-10-CM

## 2019-12-29 DIAGNOSIS — N6323 Unspecified lump in the left breast, lower outer quadrant: Secondary | ICD-10-CM | POA: Diagnosis not present

## 2020-01-15 ENCOUNTER — Other Ambulatory Visit: Payer: Self-pay | Admitting: Primary Care

## 2020-01-15 DIAGNOSIS — E890 Postprocedural hypothyroidism: Secondary | ICD-10-CM

## 2020-03-04 ENCOUNTER — Ambulatory Visit: Payer: BC Managed Care – PPO | Admitting: Podiatry

## 2020-03-04 ENCOUNTER — Ambulatory Visit (INDEPENDENT_AMBULATORY_CARE_PROVIDER_SITE_OTHER): Payer: BC Managed Care – PPO

## 2020-03-04 ENCOUNTER — Other Ambulatory Visit: Payer: Self-pay

## 2020-03-04 VITALS — Temp 96.9°F

## 2020-03-04 DIAGNOSIS — M7732 Calcaneal spur, left foot: Secondary | ICD-10-CM

## 2020-03-04 DIAGNOSIS — M216X9 Other acquired deformities of unspecified foot: Secondary | ICD-10-CM | POA: Diagnosis not present

## 2020-03-04 DIAGNOSIS — M7662 Achilles tendinitis, left leg: Secondary | ICD-10-CM

## 2020-03-04 DIAGNOSIS — M79672 Pain in left foot: Secondary | ICD-10-CM

## 2020-03-04 DIAGNOSIS — M79671 Pain in right foot: Secondary | ICD-10-CM | POA: Diagnosis not present

## 2020-03-04 DIAGNOSIS — M7661 Achilles tendinitis, right leg: Secondary | ICD-10-CM

## 2020-03-04 DIAGNOSIS — M7731 Calcaneal spur, right foot: Secondary | ICD-10-CM | POA: Diagnosis not present

## 2020-03-04 DIAGNOSIS — M9262 Juvenile osteochondrosis of tarsus, left ankle: Secondary | ICD-10-CM

## 2020-03-04 DIAGNOSIS — M9261 Juvenile osteochondrosis of tarsus, right ankle: Secondary | ICD-10-CM

## 2020-03-04 MED ORDER — METHYLPREDNISOLONE 4 MG PO TBPK: ORAL_TABLET | ORAL | 0 refills | Status: DC

## 2020-03-04 NOTE — Patient Instructions (Signed)

## 2020-03-04 NOTE — Progress Notes (Signed)
  Subjective:  Patient ID: Hannah Fritz, female    DOB: 11/01/1965,  MRN: [id number]  Chief Complaint  Patient presents with  . Foot Pain    Bilateral plantar midfoot, bottom of heels, and back of heels. x1 year. Pt stated, "Pain = 4-8/10. It's worse when I'm on my feet a lot. The back of my heels are tender".    54 y.o. female presents with the above complaint. History confirmed with patient.   Objective:  Physical Exam: warm, good capillary refill, no trophic changes or ulcerative lesions, normal DP and PT pulses and normal sensory exam. Equinus bilaterally with decreased AJ ROM, pain at left posterior heel. Cavus foot type. No images are attached to the encounter.  Radiographs: X-ray of both feet: posterior calcaneal spur and Haglund deformity noted Assessment:   1. Achilles tendonitis, bilateral   2. Equinus deformity of foot   3. Calcaneal spur of left foot   4. Calcaneal spur of right foot   5. Haglund's deformity of both heels    Plan:  Patient was evaluated and treated and all questions answered.  Achilles Tendonitis -XR reviewed with patient -Educated on stretching and icing of the affected limb. -Night splint dispensed. -Rx for Medrol 6-day taper. Advised on risks, benefits, and alternatives of the medication  Return in about 4 weeks (around 04/01/2020) for Tendonitis.

## 2020-03-09 ENCOUNTER — Other Ambulatory Visit: Payer: Self-pay | Admitting: Podiatry

## 2020-03-09 DIAGNOSIS — M7661 Achilles tendinitis, right leg: Secondary | ICD-10-CM

## 2020-04-01 ENCOUNTER — Telehealth: Payer: Self-pay | Admitting: *Deleted

## 2020-04-01 ENCOUNTER — Ambulatory Visit: Payer: BC Managed Care – PPO | Admitting: Podiatry

## 2020-04-01 ENCOUNTER — Other Ambulatory Visit: Payer: Self-pay

## 2020-04-01 VITALS — Temp 97.2°F

## 2020-04-01 DIAGNOSIS — M7662 Achilles tendinitis, left leg: Secondary | ICD-10-CM | POA: Diagnosis not present

## 2020-04-01 DIAGNOSIS — M216X9 Other acquired deformities of unspecified foot: Secondary | ICD-10-CM | POA: Diagnosis not present

## 2020-04-01 DIAGNOSIS — M7661 Achilles tendinitis, right leg: Secondary | ICD-10-CM | POA: Diagnosis not present

## 2020-04-01 NOTE — Telephone Encounter (Addendum)
Dr. March Rummage ordered PT with [facility] - in-office for B/L Achilles tendonitis, evaluation and treatment to include dry needling, Iontophoresis and other modalities at therapist discretion. Discussed rx for the iontophoresis with PT [employer] Physical Therapist recommended dexamethasone 4mg /ml for iontophoresis. Faxed to [facility].

## 2020-04-01 NOTE — Progress Notes (Signed)
  Subjective:  Patient ID: Hannah Fritz, female    DOB: Jul 22, 1966,  MRN: [id number]  Chief Complaint  Patient presents with  . Follow-up    Bilateral achilles tendonitis. Pt stated, "My pain improved while I was taking the Medrol, but then it returned. It's the same as it was before - 4-8/10. Tenderness in the back of my heels. The night splint didn't seem to help".    54 y.o. female presents with the above complaint. History confirmed with patient.   Objective:  Physical Exam: warm, good capillary refill, no trophic changes or ulcerative lesions, normal DP and PT pulses and normal sensory exam. Equinus bilaterally with decreased AJ ROM, pain at left posterior heel. Cavus foot type. No images are attached to the encounter.  Radiographs: 5/6 X-ray of both feet: posterior calcaneal spur and Haglund deformity noted Assessment:   1. Achilles tendonitis, bilateral   2. Equinus deformity of foot    Plan:  Patient was evaluated and treated and all questions answered.  Achilles Tendonitis -Referral placed to physical therapy.  -Continue Celebrex -F/u in 6 weeks  No follow-ups on file.

## 2020-04-02 NOTE — Addendum Note (Signed)
Addended by: Harriett Sine D on: 04/02/2020 09:25 AM   Modules accepted: Orders

## 2020-04-05 DIAGNOSIS — R262 Difficulty in walking, not elsewhere classified: Secondary | ICD-10-CM | POA: Diagnosis not present

## 2020-04-05 DIAGNOSIS — M7661 Achilles tendinitis, right leg: Secondary | ICD-10-CM | POA: Diagnosis not present

## 2020-04-05 DIAGNOSIS — M7662 Achilles tendinitis, left leg: Secondary | ICD-10-CM | POA: Diagnosis not present

## 2020-04-05 DIAGNOSIS — M6281 Muscle weakness (generalized): Secondary | ICD-10-CM | POA: Diagnosis not present

## 2020-04-06 ENCOUNTER — Other Ambulatory Visit: Payer: Self-pay | Admitting: Primary Care

## 2020-04-06 DIAGNOSIS — I10 Essential (primary) hypertension: Secondary | ICD-10-CM

## 2020-04-08 DIAGNOSIS — R262 Difficulty in walking, not elsewhere classified: Secondary | ICD-10-CM | POA: Diagnosis not present

## 2020-04-08 DIAGNOSIS — M6281 Muscle weakness (generalized): Secondary | ICD-10-CM | POA: Diagnosis not present

## 2020-04-08 DIAGNOSIS — M7662 Achilles tendinitis, left leg: Secondary | ICD-10-CM | POA: Diagnosis not present

## 2020-04-08 DIAGNOSIS — M7661 Achilles tendinitis, right leg: Secondary | ICD-10-CM | POA: Diagnosis not present

## 2020-04-08 NOTE — Telephone Encounter (Signed)
Last prescribed on 05/27/2019. Last OV (follow up) with Allie Bossier on 05/28/2019. No future OV scheduled

## 2020-04-09 NOTE — Telephone Encounter (Signed)
Please Advise

## 2020-04-09 NOTE — Telephone Encounter (Signed)
Refill sent to pharmacy, also MyChart message sent to patient requesting CPE for July 21.

## 2020-04-09 NOTE — Telephone Encounter (Signed)
Message left for patient to return my call.  

## 2020-04-14 DIAGNOSIS — M7661 Achilles tendinitis, right leg: Secondary | ICD-10-CM | POA: Diagnosis not present

## 2020-04-14 DIAGNOSIS — R262 Difficulty in walking, not elsewhere classified: Secondary | ICD-10-CM | POA: Diagnosis not present

## 2020-04-14 DIAGNOSIS — M7662 Achilles tendinitis, left leg: Secondary | ICD-10-CM | POA: Diagnosis not present

## 2020-04-14 DIAGNOSIS — M6281 Muscle weakness (generalized): Secondary | ICD-10-CM | POA: Diagnosis not present

## 2020-04-26 DIAGNOSIS — R262 Difficulty in walking, not elsewhere classified: Secondary | ICD-10-CM | POA: Diagnosis not present

## 2020-04-26 DIAGNOSIS — M7661 Achilles tendinitis, right leg: Secondary | ICD-10-CM | POA: Diagnosis not present

## 2020-04-26 DIAGNOSIS — M6281 Muscle weakness (generalized): Secondary | ICD-10-CM | POA: Diagnosis not present

## 2020-04-26 DIAGNOSIS — M7662 Achilles tendinitis, left leg: Secondary | ICD-10-CM | POA: Diagnosis not present

## 2020-05-07 ENCOUNTER — Other Ambulatory Visit: Payer: Self-pay | Admitting: Primary Care

## 2020-05-07 DIAGNOSIS — I10 Essential (primary) hypertension: Secondary | ICD-10-CM

## 2020-05-09 ENCOUNTER — Other Ambulatory Visit: Payer: Self-pay | Admitting: Primary Care

## 2020-05-09 DIAGNOSIS — I10 Essential (primary) hypertension: Secondary | ICD-10-CM

## 2020-05-28 ENCOUNTER — Other Ambulatory Visit: Payer: Self-pay | Admitting: Primary Care

## 2020-05-28 DIAGNOSIS — M25561 Pain in right knee: Secondary | ICD-10-CM

## 2020-05-28 DIAGNOSIS — G8929 Other chronic pain: Secondary | ICD-10-CM

## 2020-05-28 NOTE — Telephone Encounter (Signed)
Please kindly notify patient that she is overdue for her CPE/office visit with me. She will need this before we can provide refills. Please schedule, then I can fill for a 30 day supply until she's evaluated.

## 2020-05-28 NOTE — Telephone Encounter (Signed)
Last prescribed on 05/28/2019 Last OV (cpe ) with Allie Bossier on 05/28/2019  No future OV scheduled

## 2020-06-07 NOTE — Telephone Encounter (Signed)
Called patient to schedule appt with PCP for refills. LVM to call back.

## 2020-06-10 IMAGING — US US BREAST*L* LIMITED INC AXILLA
1 series · 4 of 4 positions shown · non-contrast
Comparison: Previous exam(s).

CLINICAL DATA: Short-term follow-up for probably benign left breast
mass.

EXAM:
ULTRASOUND OF THE RIGHT BREAST

[Series 1: us breast*left* limited inc axilla · 0.06mm/px · 4 of 4 slices shown]
[im 1/4]
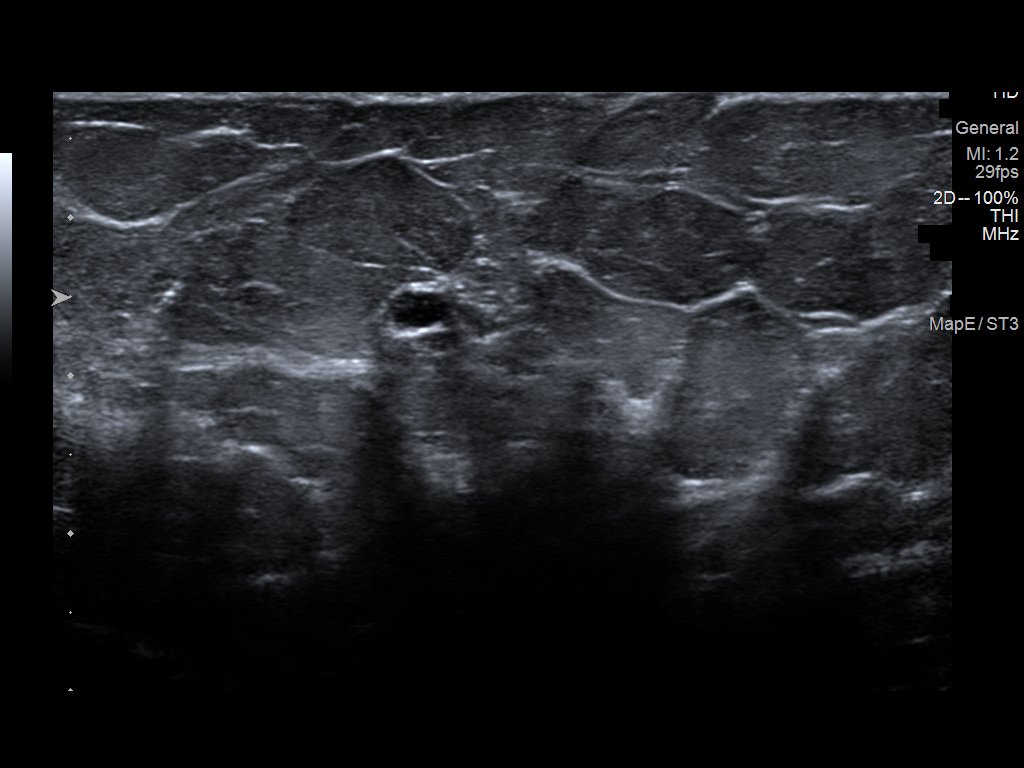
[im 2/4]
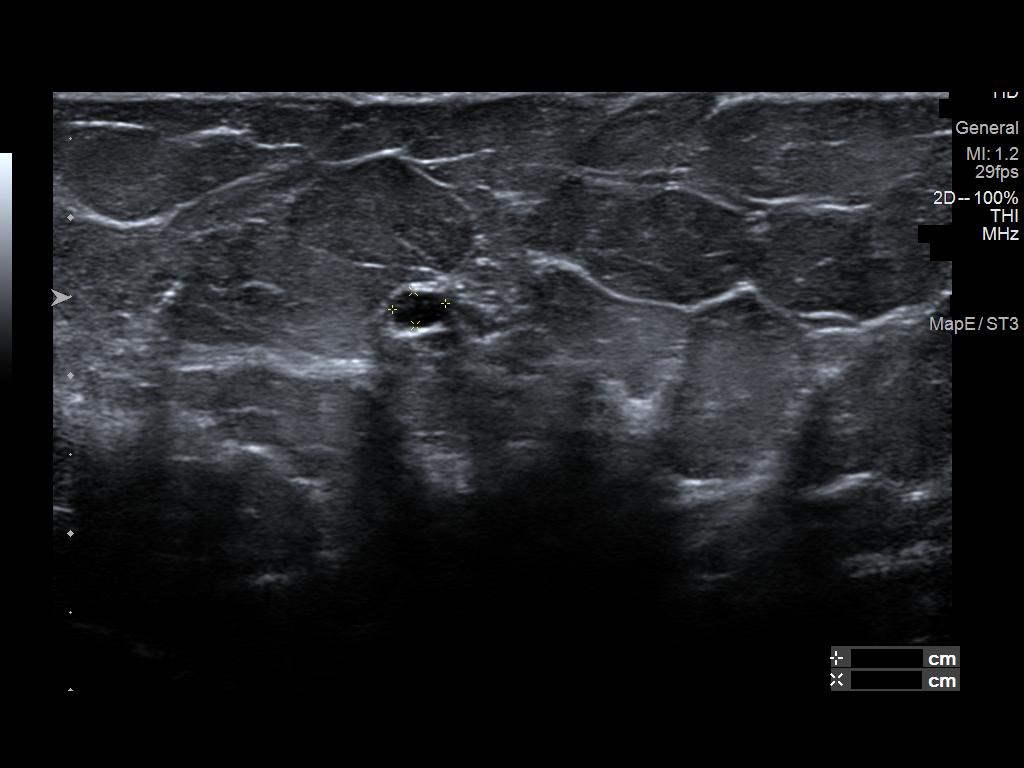
[im 3/4]
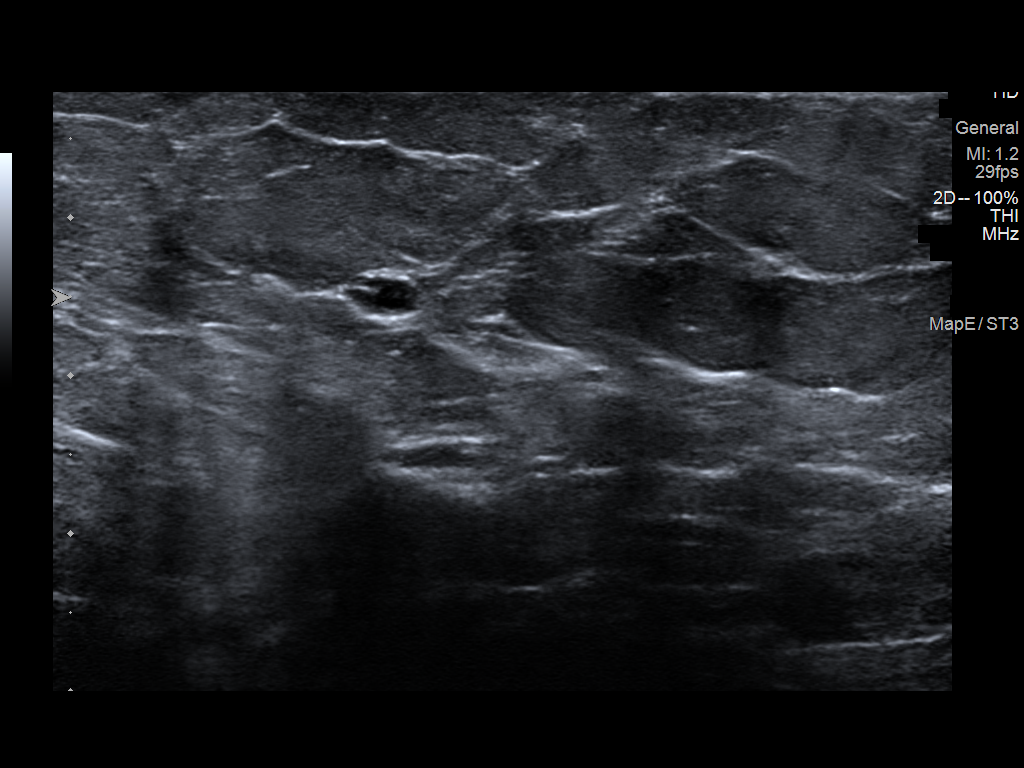
[im 4/4]
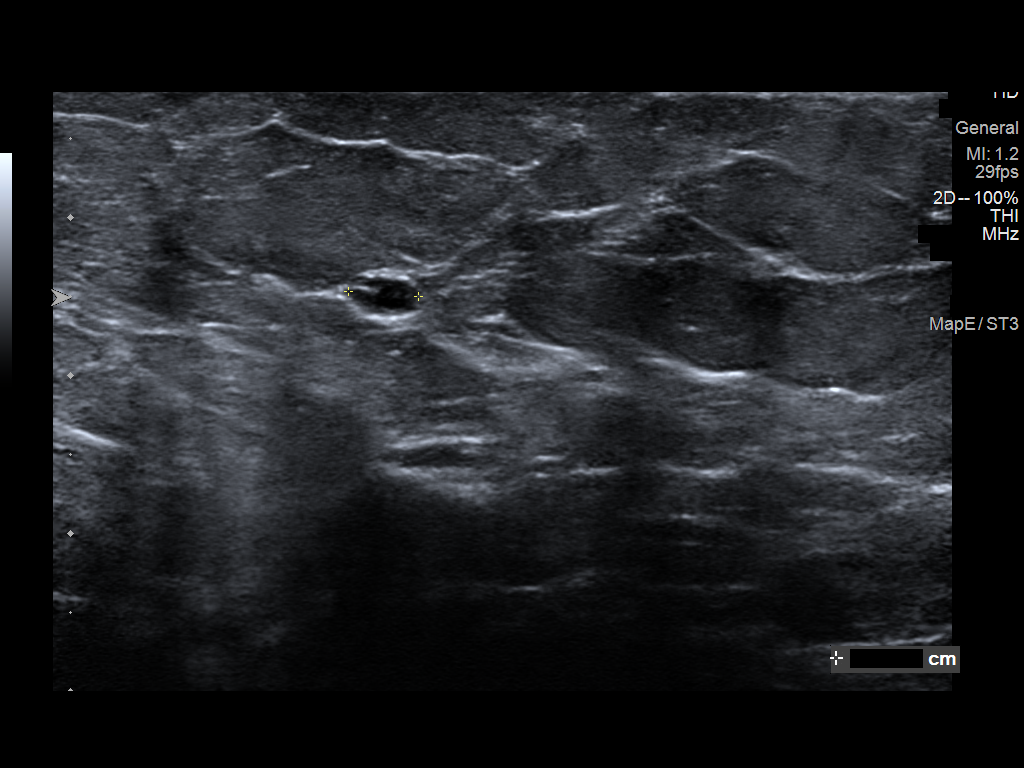

[4 of 4 positions shown; findings below may reference images not displayed]

FINDINGS: Targeted ultrasound of the left breast was performed. The oval
circumscribed hypoechoic mass in the left breast at 4 o'clock 3 cm
from nipple measures 0.3 x 0.2 x 0.4 cm, previously 0.3 x 0.2 x
cm. This is overall unchanged in size and appearance given
differences in imaging and measurement technique and demonstrates
imaging features most suggestive of a mildly complicated cyst.
IMPRESSION: Stable probably benign left breast mass.

RECOMMENDATION:
Bilateral diagnostic mammography with left breast ultrasound in 6
months.

I have discussed the findings and recommendations with the patient.
If applicable, a reminder letter will be sent to the patient
regarding the next appointment.

BI-RADS CATEGORY  3: Probably benign.

## 2020-06-16 NOTE — Telephone Encounter (Signed)
Refills sent to pharmacy. 

## 2020-06-16 NOTE — Telephone Encounter (Signed)
Called patient and scheduled for upcoming appointment for medication refills. Please advise.

## 2020-06-24 ENCOUNTER — Ambulatory Visit: Payer: BC Managed Care – PPO | Admitting: Primary Care

## 2020-07-01 ENCOUNTER — Other Ambulatory Visit: Payer: BC Managed Care – PPO

## 2020-07-13 ENCOUNTER — Other Ambulatory Visit: Payer: Self-pay

## 2020-07-13 ENCOUNTER — Ambulatory Visit (INDEPENDENT_AMBULATORY_CARE_PROVIDER_SITE_OTHER): Payer: Self-pay | Admitting: Primary Care

## 2020-07-13 ENCOUNTER — Encounter: Payer: Self-pay | Admitting: Primary Care

## 2020-07-13 VITALS — BP 134/72 | HR 75 | Temp 97.8°F | Ht 62.0 in | Wt 240.0 lb

## 2020-07-13 DIAGNOSIS — E890 Postprocedural hypothyroidism: Secondary | ICD-10-CM

## 2020-07-13 DIAGNOSIS — R7303 Prediabetes: Secondary | ICD-10-CM

## 2020-07-13 DIAGNOSIS — M25562 Pain in left knee: Secondary | ICD-10-CM

## 2020-07-13 DIAGNOSIS — I10 Essential (primary) hypertension: Secondary | ICD-10-CM

## 2020-07-13 DIAGNOSIS — Z1159 Encounter for screening for other viral diseases: Secondary | ICD-10-CM

## 2020-07-13 DIAGNOSIS — M25561 Pain in right knee: Secondary | ICD-10-CM

## 2020-07-13 DIAGNOSIS — J309 Allergic rhinitis, unspecified: Secondary | ICD-10-CM

## 2020-07-13 DIAGNOSIS — G8929 Other chronic pain: Secondary | ICD-10-CM

## 2020-07-13 LAB — COMPREHENSIVE METABOLIC PANEL
ALT: 9 U/L (ref 0–35)
AST: 17 U/L (ref 0–37)
Albumin: 4.1 g/dL (ref 3.5–5.2)
Alkaline Phosphatase: 84 U/L (ref 39–117)
BUN: 11 mg/dL (ref 6–23)
CO2: 29 mEq/L (ref 19–32)
Calcium: 9.3 mg/dL (ref 8.4–10.5)
Chloride: 101 mEq/L (ref 96–112)
Creatinine, Ser: 0.68 mg/dL (ref 0.40–1.20)
GFR: 90 mL/min (ref >60.00)
Glucose, Bld: 112 mg/dL — ABNORMAL HIGH (ref 70–99)
Potassium: 4.3 mEq/L (ref 3.5–5.1)
Sodium: 138 mEq/L (ref 135–145)
Total Bilirubin: 0.5 mg/dL (ref 0.2–1.2)
Total Protein: 7.2 g/dL (ref 6.0–8.3)

## 2020-07-13 LAB — LIPID PANEL
Cholesterol: 186 mg/dL (ref 0–200)
HDL: 54.3 mg/dL (ref >39.00)
LDL Cholesterol: 108 mg/dL — ABNORMAL HIGH (ref 0–99)
NonHDL: 132.06
Total CHOL/HDL Ratio: 3
Triglycerides: 122 mg/dL (ref 0.0–149.0)
VLDL: 24.4 mg/dL (ref 0.0–40.0)

## 2020-07-13 LAB — TSH: TSH: 2.54 u[IU]/mL (ref 0.35–4.50)

## 2020-07-13 LAB — HEMOGLOBIN A1C: Hgb A1c MFr Bld: 5.7 % (ref 4.6–6.5)

## 2020-07-13 MED ORDER — METOPROLOL SUCCINATE ER 25 MG PO TB24: 25.0000 mg | ORAL_TABLET | Freq: Every day | ORAL | 3 refills | Status: DC

## 2020-07-13 MED ORDER — CELECOXIB 200 MG PO CAPS: ORAL_CAPSULE | ORAL | 1 refills | Status: DC

## 2020-07-13 MED ORDER — LISINOPRIL 10 MG PO TABS: 10.0000 mg | ORAL_TABLET | Freq: Every day | ORAL | 3 refills | Status: DC

## 2020-07-13 NOTE — Progress Notes (Signed)
Subjective:    Patient ID: Hannah Fritz, female    DOB: 03/07/1966, 54 y.o.   MRN: [id number]  HPI  This visit occurred during the SARS-CoV-2 public health emergency.  Safety protocols were in place, including screening questions prior to the visit, additional usage of staff PPE, and extensive cleaning of exam room while observing appropriate contact time as indicated for disinfecting solutions.   Ms. Scruggs is a 54 year old female with a history of hypertension, hypothyroidism, prediabetes who presents today for medication refill and follow up.  Currently managed on levothyroxine 50 mcg tablets. She is taking levothyroxine every morning on an empty stomach with water only. She does not eat or take any other medications for 30 minutes.   Currently managed on lisinopril 10 mg, metoprolol tartrate 25 mg BID for hypertension. She is actually taking her metoprolol tartrate once daily, has always done this. She denies chest pain, dizziness, headaches.   She has been taking Claritin-D, Allegra-D, Xyzal, Zyrtec and other OTC medications for allergies over the years. Symptoms include daily, year round, watery eyes, mild rhinorrhea. She was prescribed Singulair last year, doesn't feel as though this medication helped with symptoms. She has not seen an optometrist or allergist.   BP Readings from Last 3 Encounters:  07/13/20 134/72  05/28/19 126/82  05/27/19 135/70    Currently managed on Celebrex 200 mg daily as needed for knee pain. She has chronic pain to her bilateral knees, ran out of Celebrex and notices increased pain when she doesn't take. She finds that if she takes her medication every other day, she can tolerate her symptoms. She's recently been taking her husband's Celebrex.   Review of Systems  Eyes:       Chronic watery eyes  Respiratory: Negative for shortness of breath.   Cardiovascular: Negative for chest pain.  Musculoskeletal: Positive for arthralgias.    Allergic/Immunologic: Positive for environmental allergies.  Neurological: Negative for dizziness and headaches.       Past Medical History:  Diagnosis Date  . Allergy   . Arthritis    knee  . Chronic allergic rhinitis   . Hot flashes   . Hypertension   . Hyperthyroidism   . Post-operative hypothyroidism   . Prediabetes      Social History   Socioeconomic History  . Marital status: Single    Spouse name: Not on file  . Number of children: Not on file  . Years of education: Not on file  . Highest education level: Not on file  Occupational History  . Not on file  Tobacco Use  . Smoking status: Never Smoker  . Smokeless tobacco: Never Used  Vaping Use  . Vaping Use: Never used  Substance and Sexual Activity  . Alcohol use: Not Currently  . Drug use: Never  . Sexual activity: Not on file  Other Topics Concern  . Not on file  Social History Narrative   Married.   3 children. 5 grandchildren.   Works as a Agricultural engineer.   Enjoys shopping, reading, spending time with family.   Social Determinants of Health   Financial Resource Strain:   . Difficulty of Paying Living Expenses: Not on file  Food Insecurity:   . Worried About Charity fundraiser in the Last Year: Not on file  . Ran Out of Food in the Last Year: Not on file  Transportation Needs:   . Lack of Transportation (Medical): Not on file  . Lack of Transportation (Non-Medical):  Not on file  Physical Activity:   . Days of Exercise per Week: Not on file  . Minutes of Exercise per Session: Not on file  Stress:   . Feeling of Stress : Not on file  Social Connections:   . Frequency of Communication with Friends and Family: Not on file  . Frequency of Social Gatherings with Friends and Family: Not on file  . Attends Religious Services: Not on file  . Active Member of Clubs or Organizations: Not on file  . Attends Archivist Meetings: Not on file  . Marital Status: Not on file  Intimate Partner  Violence:   . Fear of Current or Ex-Partner: Not on file  . Emotionally Abused: Not on file  . Physically Abused: Not on file  . Sexually Abused: Not on file    Past Surgical History:  Procedure Laterality Date  . REPLACEMENT TOTAL KNEE     right  . THYROIDECTOMY, PARTIAL    . TUBAL LIGATION  1995    Family History  Problem Relation Age of Onset  . Arthritis Father   . Hypertension Paternal Grandmother   . Colon polyps Paternal Grandmother   . Hypertension Paternal Grandfather   . Colon cancer Neg Hx   . Esophageal cancer Neg Hx   . Stomach cancer Neg Hx   . Rectal cancer Neg Hx   . Breast cancer Neg Hx     Allergies  Allergen Reactions  . Sulfa Antibiotics Hives    Current Outpatient Medications on File Prior to Visit  Medication Sig Dispense Refill  . Biotin 5000 MCG TABS Take by mouth daily.    Marland Kitchen levothyroxine (SYNTHROID) 50 MCG tablet Take 50 mcg by mouth every morning.    . loratadine (CLARITIN) 10 MG tablet Take 10 mg by mouth daily.     No current facility-administered medications on file prior to visit.    BP 134/72   Pulse 75   Temp 97.8 F (36.6 C) (Temporal)   Ht 5\' 2"  (1.575 m)   Wt 240 lb (108.9 kg)   SpO2 97%   BMI 43.90 kg/m    Objective:   Physical Exam Cardiovascular:     Rate and Rhythm: Normal rate and regular rhythm.  Pulmonary:     Effort: Pulmonary effort is normal.     Breath sounds: Normal breath sounds.  Musculoskeletal:     Cervical back: Neck supple.  Skin:    General: Skin is warm and dry.            Assessment & Plan:

## 2020-07-13 NOTE — Assessment & Plan Note (Addendum)
No improvement despite numerous OTC treatments and Singulair. She does not wish to use eye drops. I recommended she set up a visit with her optometrist. We will also refer her to an allergist.   Discussed to STOP using agents with sudafed.

## 2020-07-13 NOTE — Assessment & Plan Note (Signed)
Discussed the importance of a healthy diet and regular exercise in order for weight loss, and to reduce the risk of any potential medical problems.  Repeat A1C pending. 

## 2020-07-13 NOTE — Assessment & Plan Note (Signed)
Chronic, cannot tolerate being without Celebrex. Discussed potential side effect of renal disease. Will have her take every other day. Repeat renal function pending.

## 2020-07-13 NOTE — Assessment & Plan Note (Signed)
Borderline in the office today, recommended she obtain a BP cuff and start checking at home.  Continue lisinopril 10 mg for now. Changed metoprolol tartrate to metoprolol succinate as she's been taking this once daily.  CMP pending.

## 2020-07-13 NOTE — Patient Instructions (Signed)
Stop by the lab prior to leaving today. I will notify you of your results once received.   Start metoprolol succinate XL 25 mg. Take this once daily for heart rate.  You will be contacted regarding your referral to the allergist.  Please let us know if you have not been contacted within two weeks.   Be sure to take your levothyroxine (thyroid medication) every morning on an empty stomach with water only. No food or other medications for 30 minutes. No heartburn medication, iron pills, calcium, vitamin D, or magnesium pills within four hours of taking levothyroxine.   It was a pleasure to see you today!

## 2020-07-13 NOTE — Assessment & Plan Note (Signed)
She is taking levothyroxine correctly. Repeat TSH pending. 

## 2020-07-14 LAB — HEPATITIS C ANTIBODY
Hepatitis C Ab: NONREACTIVE
SIGNAL TO CUT-OFF: 0.02 (ref <1.00)

## 2020-07-23 ENCOUNTER — Other Ambulatory Visit: Payer: Self-pay | Admitting: Primary Care

## 2020-09-01 ENCOUNTER — Ambulatory Visit: Payer: BC Managed Care – PPO | Admitting: Allergy & Immunology

## 2020-11-03 ENCOUNTER — Ambulatory Visit: Payer: Self-pay | Admitting: Allergy & Immunology

## 2020-11-08 DIAGNOSIS — I10 Essential (primary) hypertension: Secondary | ICD-10-CM

## 2020-11-10 MED ORDER — OLMESARTAN MEDOXOMIL 20 MG PO TABS: 20.0000 mg | ORAL_TABLET | Freq: Every day | ORAL | 0 refills | Status: DC

## 2020-11-11 ENCOUNTER — Other Ambulatory Visit: Payer: Self-pay | Admitting: Primary Care

## 2020-11-11 DIAGNOSIS — I10 Essential (primary) hypertension: Secondary | ICD-10-CM

## 2021-02-04 ENCOUNTER — Other Ambulatory Visit (HOSPITAL_COMMUNITY)
Admission: RE | Admit: 2021-02-04 | Discharge: 2021-02-04 | Disposition: A | Discharge status: Home / Self Care | Payer: BC Managed Care – PPO | Source: Ambulatory Visit | Attending: Primary Care | Admitting: Primary Care

## 2021-02-04 ENCOUNTER — Ambulatory Visit (INDEPENDENT_AMBULATORY_CARE_PROVIDER_SITE_OTHER): Payer: Self-pay | Admitting: Primary Care

## 2021-02-04 ENCOUNTER — Other Ambulatory Visit: Payer: Self-pay

## 2021-02-04 ENCOUNTER — Encounter: Payer: Self-pay | Admitting: Primary Care

## 2021-02-04 ENCOUNTER — Other Ambulatory Visit: Payer: Self-pay | Admitting: Primary Care

## 2021-02-04 VITALS — BP 114/62 | HR 72 | Temp 97.4°F | Ht 62.0 in | Wt 243.0 lb

## 2021-02-04 DIAGNOSIS — M25562 Pain in left knee: Secondary | ICD-10-CM

## 2021-02-04 DIAGNOSIS — E890 Postprocedural hypothyroidism: Secondary | ICD-10-CM

## 2021-02-04 DIAGNOSIS — I10 Essential (primary) hypertension: Secondary | ICD-10-CM

## 2021-02-04 DIAGNOSIS — J309 Allergic rhinitis, unspecified: Secondary | ICD-10-CM

## 2021-02-04 DIAGNOSIS — Z124 Encounter for screening for malignant neoplasm of cervix: Secondary | ICD-10-CM

## 2021-02-04 DIAGNOSIS — R7303 Prediabetes: Secondary | ICD-10-CM

## 2021-02-04 DIAGNOSIS — Z0000 Encounter for general adult medical examination without abnormal findings: Secondary | ICD-10-CM

## 2021-02-04 DIAGNOSIS — N6320 Unspecified lump in the left breast, unspecified quadrant: Secondary | ICD-10-CM

## 2021-02-04 DIAGNOSIS — M25561 Pain in right knee: Secondary | ICD-10-CM

## 2021-02-04 DIAGNOSIS — Z23 Encounter for immunization: Secondary | ICD-10-CM

## 2021-02-04 DIAGNOSIS — G8929 Other chronic pain: Secondary | ICD-10-CM

## 2021-02-04 LAB — COMPREHENSIVE METABOLIC PANEL
ALT: 11 U/L (ref 0–35)
AST: 19 U/L (ref 0–37)
Albumin: 4.1 g/dL (ref 3.5–5.2)
Alkaline Phosphatase: 84 U/L (ref 39–117)
BUN: 9 mg/dL (ref 6–23)
CO2: 31 mEq/L (ref 19–32)
Calcium: 9.5 mg/dL (ref 8.4–10.5)
Chloride: 100 mEq/L (ref 96–112)
Creatinine, Ser: 0.7 mg/dL (ref 0.40–1.20)
GFR: 97.62 mL/min (ref >60.00)
Glucose, Bld: 103 mg/dL — ABNORMAL HIGH (ref 70–99)
Potassium: 4.2 mEq/L (ref 3.5–5.1)
Sodium: 137 mEq/L (ref 135–145)
Total Bilirubin: 0.6 mg/dL (ref 0.2–1.2)
Total Protein: 7.1 g/dL (ref 6.0–8.3)

## 2021-02-04 LAB — TSH: TSH: 2.75 u[IU]/mL (ref 0.35–4.50)

## 2021-02-04 LAB — HEMOGLOBIN A1C: Hgb A1c MFr Bld: 5.7 % (ref 4.6–6.5)

## 2021-02-04 MED ORDER — CELECOXIB 200 MG PO CAPS: 200.0000 mg | ORAL_CAPSULE | Freq: Every day | ORAL | 1 refills | Status: AC | PRN

## 2021-02-04 MED ORDER — LEVOCETIRIZINE DIHYDROCHLORIDE 5 MG PO TABS: 5.0000 mg | ORAL_TABLET | Freq: Every evening | ORAL | 0 refills | Status: DC

## 2021-02-04 NOTE — Assessment & Plan Note (Signed)
Discussed the importance of a healthy diet and regular exercise in order for weight loss, and to reduce the risk of any potential medical problems.  Repeat A1C pending. 

## 2021-02-04 NOTE — Assessment & Plan Note (Signed)
Well controlled in the office today, continue olmesartan 20 mg and metoprolol succinate 25 mg. Continue same. CMP pending.

## 2021-02-04 NOTE — Progress Notes (Addendum)
Subjective:    Patient ID: Hannah Fritz, female    DOB: 10-May-1966, 55 y.o.   MRN: [id number]  HPI  Hannah Fritz is a very pleasant 55 y.o. female who presents today for complete physical.   She is also wanting a refill of allergy medication, Claritin and Zyrtec have been ineffective.  Immunizations: -Tetanus: 2018 -Influenza: Declines -Covid-19: Has not completed -Shingles: Has not completed    Diet: She endorses a fair diet  Exercise: No regular exercise   Eye exam: No recent exam Dental exam: Completes regularly   Pap Smear: March 2018, due  Mammogram: Overdue Colonoscopy: Completed in 2020, due in 2027  BP Readings from Last 3 Encounters:  02/04/21 114/62  07/13/20 134/72  05/28/19 126/82       Review of Systems  Constitutional: Negative for unexpected weight change.  HENT: Negative for rhinorrhea.   Eyes: Negative for visual disturbance.  Respiratory: Negative for cough and shortness of breath.   Cardiovascular: Negative for chest pain.  Gastrointestinal: Negative for constipation and diarrhea.  Genitourinary: Negative for difficulty urinating.  Musculoskeletal: Positive for arthralgias.  Skin: Negative for rash.  Allergic/Immunologic: Positive for environmental allergies.  Neurological: Negative for dizziness, numbness and headaches.  Psychiatric/Behavioral: The patient is not nervous/anxious.          Past Medical History:  Diagnosis Date  . Allergy   . Arthritis    knee  . Chronic allergic rhinitis   . Hot flashes   . Hypertension   . Hyperthyroidism   . Post-operative hypothyroidism   . Prediabetes     Social History   Socioeconomic History  . Marital status: Single    Spouse name: Not on file  . Number of children: Not on file  . Years of education: Not on file  . Highest education level: Not on file  Occupational History  . Not on file  Tobacco Use  . Smoking status: Never Smoker  . Smokeless tobacco: Never Used   Vaping Use  . Vaping Use: Never used  Substance and Sexual Activity  . Alcohol use: Not Currently  . Drug use: Never  . Sexual activity: Not on file  Other Topics Concern  . Not on file  Social History Narrative   Married.   3 children. 5 grandchildren.   Works as a Agricultural engineer.   Enjoys shopping, reading, spending time with family.   Social Determinants of Health   Financial Resource Strain: Not on file  Food Insecurity: Not on file  Transportation Needs: Not on file  Physical Activity: Not on file  Stress: Not on file  Social Connections: Not on file  Intimate Partner Violence: Not on file    Past Surgical History:  Procedure Laterality Date  . REPLACEMENT TOTAL KNEE     right  . THYROIDECTOMY, PARTIAL    . TUBAL LIGATION  1995    Family History  Problem Relation Age of Onset  . Arthritis Father   . Hypertension Paternal Grandmother   . Colon polyps Paternal Grandmother   . Hypertension Paternal Grandfather   . Colon cancer Neg Hx   . Esophageal cancer Neg Hx   . Stomach cancer Neg Hx   . Rectal cancer Neg Hx   . Breast cancer Neg Hx     Allergies  Allergen Reactions  . Sulfa Antibiotics Hives    Current Outpatient Medications on File Prior to Visit  Medication Sig Dispense Refill  . Biotin 5000 MCG TABS Take by mouth  daily.    . celecoxib (CELEBREX) 200 MG capsule TAKE 1 CAPSULE(200 MG) BY MOUTH every other day AS NEEDED FOR PAIN 45 capsule 1  . levothyroxine (SYNTHROID) 50 MCG tablet TAKE 1 TABLET BY MOUTH EVERY MORNING ON AN EMPTY STOMACH WITH WATER ONLY. NO FOOD OR OTHER MEDICATIONS FOR 30 MINUTES 90 tablet 3  . metoprolol succinate (TOPROL-XL) 25 MG 24 hr tablet Take 1 tablet (25 mg total) by mouth daily. For heart rate. 90 tablet 3  . olmesartan (BENICAR) 20 MG tablet TAKE 1 TABLET(20 MG) BY MOUTH DAILY FOR BLOOD PRESSURE 90 tablet 1   No current facility-administered medications on file prior to visit.    BP 114/62   Pulse 72   Temp (!) 97.4 F  (36.3 C) (Temporal)   Ht 5\' 2"  (1.575 m)   Wt 243 lb (110.2 kg)   SpO2 99%   BMI 44.45 kg/m  Objective:   Physical Exam HENT:     Right Ear: Tympanic membrane and ear canal normal.     Left Ear: Tympanic membrane and ear canal normal.     Nose: Nose normal.  Eyes:     Conjunctiva/sclera: Conjunctivae normal.     Pupils: Pupils are equal, round, and reactive to light.  Neck:     Thyroid: No thyromegaly.  Cardiovascular:     Rate and Rhythm: Normal rate and regular rhythm.     Heart sounds: No murmur heard.   Pulmonary:     Effort: Pulmonary effort is normal.     Breath sounds: Normal breath sounds. No rales.  Abdominal:     General: Bowel sounds are normal.     Palpations: Abdomen is soft.     Tenderness: There is no abdominal tenderness.  Genitourinary:    Labia:        Right: No tenderness or lesion.        Left: No tenderness or lesion.      Vagina: Normal.     Cervix: Normal.     Uterus: Normal.      Adnexa: Right adnexa normal and left adnexa normal.  Musculoskeletal:        General: Normal range of motion.     Cervical back: Neck supple.  Lymphadenopathy:     Cervical: No cervical adenopathy.  Skin:    General: Skin is warm and dry.     Findings: No rash.  Neurological:     Mental Status: She is alert and oriented to person, place, and time.     Cranial Nerves: No cranial nerve deficit.     Deep Tendon Reflexes: Reflexes are normal and symmetric.  Psychiatric:        Mood and Affect: Mood normal.           Assessment & Plan:      This visit occurred during the SARS-CoV-2 public health emergency.  Safety protocols were in place, including screening questions prior to the visit, additional usage of staff PPE, and extensive cleaning of exam room while observing appropriate contact time as indicated for disinfecting solutions.

## 2021-02-04 NOTE — Patient Instructions (Signed)
Stop by the lab prior to leaving today. I will notify you of your results once received.   Start exercising. You should be getting 150 minutes of moderate intensity exercise weekly.  It's important to improve your diet by reducing consumption of fast food, fried food, processed snack foods, sugary drinks. Increase consumption of fresh vegetables and fruits, whole grains, water.  Ensure you are drinking 64 ounces of water daily.  [facility] to schedule your mammogram.   It was a pleasure to see you today!   Preventive Care 27-84 Years Old, Female Preventive care refers to lifestyle choices and visits with your health care provider that can promote health and wellness. This includes:  A yearly physical exam. This is also called an annual wellness visit.  Regular dental and eye exams.  Immunizations.  Screening for certain conditions.  Healthy lifestyle choices, such as: ? Eating a healthy diet. ? Getting regular exercise. ? Not using drugs or products that contain nicotine and tobacco. ? Limiting alcohol use. What can I expect for my preventive care visit? Physical exam Your health care provider will check your:  Height and weight. These may be used to calculate your BMI (body mass index). BMI is a measurement that tells if you are at a healthy weight.  Heart rate and blood pressure.  Body temperature.  Skin for abnormal spots. Counseling Your health care provider may ask you questions about your:  Past medical problems.  Family's medical history.  Alcohol, tobacco, and drug use.  Emotional well-being.  Home life and relationship well-being.  Sexual activity.  Diet, exercise, and sleep habits.  Work and work Statistician.  Access to firearms.  Method of birth control.  Menstrual cycle.  Pregnancy history. What immunizations do I need? Vaccines are usually given at various ages, according to a schedule. Your health care provider will  recommend vaccines for you based on your age, medical history, and lifestyle or other factors, such as travel or where you work.   What tests do I need? Blood tests  Lipid and cholesterol levels. These may be checked every 5 years, or more often if you are over 39 years old.  Hepatitis C test.  Hepatitis B test. Screening  Lung cancer screening. You may have this screening every year starting at age 70 if you have a 30-pack-year history of smoking and currently smoke or have quit within the past 15 years.  Colorectal cancer screening. ? All adults should have this screening starting at age 56 and continuing until age 84. ? Your health care provider may recommend screening at age 9 if you are at increased risk. ? You will have tests every 1-10 years, depending on your results and the type of screening test.  Diabetes screening. ? This is done by checking your blood sugar (glucose) after you have not eaten for a while (fasting). ? You may have this done every 1-3 years.  Mammogram. ? This may be done every 1-2 years. ? Talk with your health care provider about when you should start having regular mammograms. This may depend on whether you have a family history of breast cancer.  BRCA-related cancer screening. This may be done if you have a family history of breast, ovarian, tubal, or peritoneal cancers.  Pelvic exam and Pap test. ? This may be done every 3 years starting at age 47. ? Starting at age 58, this may be done every 5 years if you have a Pap test in combination  with an HPV test. Other tests  STD (sexually transmitted disease) testing, if you are at risk.  Bone density scan. This is done to screen for osteoporosis. You may have this scan if you are at high risk for osteoporosis. Talk with your health care provider about your test results, treatment options, and if necessary, the need for more tests. Follow these instructions at home: Eating and drinking  Eat a diet  that includes fresh fruits and vegetables, whole grains, lean protein, and low-fat dairy products.  Take vitamin and mineral supplements as recommended by your health care provider.  Do not drink alcohol if: ? Your health care provider tells you not to drink. ? You are pregnant, may be pregnant, or are planning to become pregnant.  If you drink alcohol: ? Limit how much you have to 0-1 drink a day. ? Be aware of how much alcohol is in your drink. In the [place] one drink equals one 12 oz bottle of beer (355 mL), one 5 oz glass of wine (148 mL), or one 1 oz glass of hard liquor (44 mL).   Lifestyle  Take daily care of your teeth and gums. Brush your teeth every morning and night with fluoride toothpaste. Floss one time each day.  Stay active. Exercise for at least 30 minutes 5 or more days each week.  Do not use any products that contain nicotine or tobacco, such as cigarettes, e-cigarettes, and chewing tobacco. If you need help quitting, ask your health care provider.  Do not use drugs.  If you are sexually active, practice safe sex. Use a condom or other form of protection to prevent STIs (sexually transmitted infections).  If you do not wish to become pregnant, use a form of birth control. If you plan to become pregnant, see your health care provider for a prepregnancy visit.  If told by your health care provider, take low-dose aspirin daily starting at age 99.  Find healthy ways to cope with stress, such as: ? Meditation, yoga, or listening to music. ? Journaling. ? Talking to a trusted person. ? Spending time with friends and family. Safety  Always wear your seat belt while driving or riding in a vehicle.  Do not drive: ? If you have been drinking alcohol. Do not ride with someone who has been drinking. ? When you are tired or distracted. ? While texting.  Wear a helmet and other protective equipment during sports activities.  If you have firearms in your house, make  sure you follow all gun safety procedures. What's next?  Visit your health care provider once a year for an annual wellness visit.  Ask your health care provider how often you should have your eyes and teeth checked.  Stay up to date on all vaccines. This information is not intended to replace advice given to you by your health care provider. Make sure you discuss any questions you have with your health care provider. Document Revised: 07/20/2020 Document Reviewed: 06/27/2018 Elsevier Patient Education  2021 [employer].

## 2021-02-04 NOTE — Assessment & Plan Note (Signed)
Has been off Celebrex 200 mg for months, has noticed increased pain.   Refills provided.  Checking renal function today.

## 2021-02-04 NOTE — Assessment & Plan Note (Signed)
Claritin ineffective as well as Zyrtec. Will trial Xyzal. She will update.

## 2021-02-04 NOTE — Assessment & Plan Note (Signed)
Compliant to levothyroxine 50 mcg, taking correctly. Repeat TSH pending.

## 2021-02-04 NOTE — Assessment & Plan Note (Signed)
Declines Covid vaccines. First Shingrix provided. Pap smear due, completed today. Diagnostic mammogram and ultrasound overdue, ordered. Colonoscopy UTD, due in 2027  Discussed the importance of a healthy diet and regular exercise in order for weight loss, and to reduce the risk of any potential medical problems.  Exam today as noted. Labs pending.

## 2021-02-07 NOTE — Addendum Note (Signed)
Addended by: Francella Solian on: 02/07/2021 08:55 AM   Modules accepted: Orders

## 2021-02-08 LAB — CYTOLOGY - PAP
Adequacy: ABSENT
Comment: NEGATIVE
Diagnosis: NEGATIVE
High risk HPV: NEGATIVE

## 2021-03-03 ENCOUNTER — Other Ambulatory Visit: Payer: Self-pay | Admitting: Primary Care

## 2021-03-03 DIAGNOSIS — J309 Allergic rhinitis, unspecified: Secondary | ICD-10-CM

## 2021-03-17 ENCOUNTER — Ambulatory Visit: Admission: RE | Admit: 2021-03-17 | Payer: BC Managed Care – PPO | Source: Ambulatory Visit

## 2021-03-17 ENCOUNTER — Ambulatory Visit
Admission: RE | Admit: 2021-03-17 | Discharge: 2021-03-17 | Disposition: A | Discharge status: Home / Self Care | Payer: BC Managed Care – PPO | Source: Ambulatory Visit | Attending: Primary Care | Admitting: Primary Care

## 2021-03-17 DIAGNOSIS — N6320 Unspecified lump in the left breast, unspecified quadrant: Secondary | ICD-10-CM

## 2021-03-17 DIAGNOSIS — R922 Inconclusive mammogram: Secondary | ICD-10-CM | POA: Diagnosis not present

## 2021-05-10 ENCOUNTER — Other Ambulatory Visit: Payer: Self-pay

## 2021-05-10 ENCOUNTER — Ambulatory Visit (INDEPENDENT_AMBULATORY_CARE_PROVIDER_SITE_OTHER): Payer: BC Managed Care – PPO | Admitting: *Deleted

## 2021-05-10 DIAGNOSIS — Z23 Encounter for immunization: Secondary | ICD-10-CM | POA: Diagnosis not present

## 2021-05-13 ENCOUNTER — Other Ambulatory Visit: Payer: Self-pay

## 2021-05-13 DIAGNOSIS — I10 Essential (primary) hypertension: Secondary | ICD-10-CM

## 2021-05-13 MED ORDER — LEVOTHYROXINE SODIUM 50 MCG PO TABS: ORAL_TABLET | ORAL | 0 refills | Status: AC

## 2021-05-13 MED ORDER — OLMESARTAN MEDOXOMIL 20 MG PO TABS: ORAL_TABLET | ORAL | 0 refills | Status: DC

## 2021-06-04 ENCOUNTER — Other Ambulatory Visit: Payer: Self-pay | Admitting: Family Medicine

## 2021-06-04 DIAGNOSIS — J309 Allergic rhinitis, unspecified: Secondary | ICD-10-CM

## 2021-07-12 DIAGNOSIS — I10 Essential (primary) hypertension: Secondary | ICD-10-CM | POA: Diagnosis not present

## 2021-07-12 DIAGNOSIS — J309 Allergic rhinitis, unspecified: Secondary | ICD-10-CM | POA: Diagnosis not present

## 2021-07-12 DIAGNOSIS — M79671 Pain in right foot: Secondary | ICD-10-CM | POA: Diagnosis not present

## 2021-07-12 DIAGNOSIS — E039 Hypothyroidism, unspecified: Secondary | ICD-10-CM | POA: Diagnosis not present

## 2021-07-12 DIAGNOSIS — M79672 Pain in left foot: Secondary | ICD-10-CM | POA: Diagnosis not present

## 2021-08-11 ENCOUNTER — Other Ambulatory Visit: Payer: Self-pay | Admitting: Primary Care

## 2021-08-11 DIAGNOSIS — I10 Essential (primary) hypertension: Secondary | ICD-10-CM

## 2021-08-14 NOTE — Telephone Encounter (Signed)
See my chart message, patient hasn't responded to me. Can you call her?

## 2021-08-15 ENCOUNTER — Other Ambulatory Visit: Payer: Self-pay | Admitting: Primary Care

## 2021-08-15 DIAGNOSIS — I10 Essential (primary) hypertension: Secondary | ICD-10-CM

## 2021-08-19 NOTE — Telephone Encounter (Signed)
Patient husband is working there so they have been staying. She does need sent to [facility].

## 2021-09-06 DIAGNOSIS — M216X1 Other acquired deformities of right foot: Secondary | ICD-10-CM | POA: Diagnosis not present

## 2021-09-06 DIAGNOSIS — M216X2 Other acquired deformities of left foot: Secondary | ICD-10-CM | POA: Diagnosis not present

## 2021-09-06 DIAGNOSIS — M722 Plantar fascial fibromatosis: Secondary | ICD-10-CM | POA: Diagnosis not present

## 2021-09-15 DIAGNOSIS — M79671 Pain in right foot: Secondary | ICD-10-CM | POA: Diagnosis not present

## 2021-09-15 DIAGNOSIS — R52 Pain, unspecified: Secondary | ICD-10-CM | POA: Diagnosis not present

## 2021-09-15 DIAGNOSIS — M79672 Pain in left foot: Secondary | ICD-10-CM | POA: Diagnosis not present
